# Patient Record
Sex: Female | Born: 1935 | Race: White | Hispanic: No | Smoking: Former smoker
Health system: Southern US, Community
[De-identification: ages and names within clinical notes are randomized; demographics above are authoritative.]

## PROBLEM LIST (undated history)

## (undated) DIAGNOSIS — M2550 Pain in unspecified joint: Secondary | ICD-10-CM

## (undated) DIAGNOSIS — E039 Hypothyroidism, unspecified: Secondary | ICD-10-CM

## (undated) DIAGNOSIS — M545 Low back pain, unspecified: Secondary | ICD-10-CM

## (undated) DIAGNOSIS — M1990 Unspecified osteoarthritis, unspecified site: Secondary | ICD-10-CM

## (undated) DIAGNOSIS — R350 Frequency of micturition: Secondary | ICD-10-CM

## (undated) DIAGNOSIS — Z8619 Personal history of other infectious and parasitic diseases: Secondary | ICD-10-CM

## (undated) DIAGNOSIS — M810 Age-related osteoporosis without current pathological fracture: Secondary | ICD-10-CM

## (undated) DIAGNOSIS — K219 Gastro-esophageal reflux disease without esophagitis: Secondary | ICD-10-CM

## (undated) DIAGNOSIS — H3530 Unspecified macular degeneration: Secondary | ICD-10-CM

## (undated) DIAGNOSIS — C801 Malignant (primary) neoplasm, unspecified: Secondary | ICD-10-CM

## (undated) DIAGNOSIS — I10 Essential (primary) hypertension: Secondary | ICD-10-CM

## (undated) DIAGNOSIS — K5900 Constipation, unspecified: Secondary | ICD-10-CM

## (undated) DIAGNOSIS — T4145XA Adverse effect of unspecified anesthetic, initial encounter: Secondary | ICD-10-CM

## (undated) DIAGNOSIS — G4700 Insomnia, unspecified: Secondary | ICD-10-CM

## (undated) DIAGNOSIS — K5790 Diverticulosis of intestine, part unspecified, without perforation or abscess without bleeding: Secondary | ICD-10-CM

## (undated) DIAGNOSIS — T8859XA Other complications of anesthesia, initial encounter: Secondary | ICD-10-CM

## (undated) DIAGNOSIS — R1310 Dysphagia, unspecified: Secondary | ICD-10-CM

## (undated) DIAGNOSIS — M2540 Effusion, unspecified joint: Secondary | ICD-10-CM

## (undated) DIAGNOSIS — R351 Nocturia: Secondary | ICD-10-CM

## (undated) DIAGNOSIS — H409 Unspecified glaucoma: Secondary | ICD-10-CM

## (undated) DIAGNOSIS — K5792 Diverticulitis of intestine, part unspecified, without perforation or abscess without bleeding: Secondary | ICD-10-CM

## (undated) DIAGNOSIS — K529 Noninfective gastroenteritis and colitis, unspecified: Secondary | ICD-10-CM

## (undated) HISTORY — PX: NECK SURGERY: SHX720

## (undated) HISTORY — DX: Malignant (primary) neoplasm, unspecified: C80.1

## (undated) HISTORY — PX: SHOULDER ARTHROSCOPY WITH LABRAL REPAIR: SHX5691

## (undated) HISTORY — PX: BACK SURGERY: SHX140

## (undated) HISTORY — PX: OTHER SURGICAL HISTORY: SHX169

## (undated) HISTORY — PX: HAND SURGERY: SHX662

## (undated) HISTORY — PX: COLONOSCOPY: SHX174

## (undated) HISTORY — PX: HIP PINNING: SHX1757

## (undated) HISTORY — DX: Essential (primary) hypertension: I10

## (undated) HISTORY — PX: KNEE FUSION: SUR623

---

## 1993-07-17 HISTORY — PX: BREAST SURGERY: SHX581

## 1998-05-24 ENCOUNTER — Encounter: Payer: Self-pay | Admitting: Endocrinology

## 1998-05-24 ENCOUNTER — Ambulatory Visit (HOSPITAL_COMMUNITY): Admission: RE | Admit: 1998-05-24 | Discharge: 1998-05-24 | Payer: Self-pay | Admitting: Endocrinology

## 1998-06-02 ENCOUNTER — Ambulatory Visit (HOSPITAL_COMMUNITY): Admission: RE | Admit: 1998-06-02 | Discharge: 1998-06-02 | Payer: Self-pay | Admitting: Endocrinology

## 1998-06-02 ENCOUNTER — Encounter: Payer: Self-pay | Admitting: Endocrinology

## 1999-05-27 ENCOUNTER — Encounter: Payer: Self-pay | Admitting: Endocrinology

## 1999-05-27 ENCOUNTER — Encounter: Admission: RE | Admit: 1999-05-27 | Discharge: 1999-05-27 | Payer: Self-pay | Admitting: Endocrinology

## 2000-04-14 ENCOUNTER — Emergency Department (HOSPITAL_COMMUNITY): Admission: EM | Admit: 2000-04-14 | Discharge: 2000-04-14 | Payer: Self-pay | Admitting: Emergency Medicine

## 2000-06-19 ENCOUNTER — Encounter: Admission: RE | Admit: 2000-06-19 | Discharge: 2000-06-19 | Payer: Self-pay | Admitting: Endocrinology

## 2000-06-19 ENCOUNTER — Encounter: Payer: Self-pay | Admitting: Endocrinology

## 2001-07-02 ENCOUNTER — Encounter: Admission: RE | Admit: 2001-07-02 | Discharge: 2001-07-02 | Payer: Self-pay | Admitting: Endocrinology

## 2001-07-02 ENCOUNTER — Encounter: Payer: Self-pay | Admitting: Endocrinology

## 2001-10-30 ENCOUNTER — Encounter: Payer: Self-pay | Admitting: Endocrinology

## 2001-10-30 ENCOUNTER — Encounter: Admission: RE | Admit: 2001-10-30 | Discharge: 2001-10-30 | Payer: Self-pay | Admitting: Endocrinology

## 2002-06-18 ENCOUNTER — Encounter: Payer: Self-pay | Admitting: Endocrinology

## 2002-06-18 ENCOUNTER — Encounter: Admission: RE | Admit: 2002-06-18 | Discharge: 2002-06-18 | Payer: Self-pay | Admitting: Endocrinology

## 2002-06-27 ENCOUNTER — Encounter: Admission: RE | Admit: 2002-06-27 | Discharge: 2002-06-27 | Payer: Self-pay | Admitting: Endocrinology

## 2002-06-27 ENCOUNTER — Encounter (INDEPENDENT_AMBULATORY_CARE_PROVIDER_SITE_OTHER): Payer: Self-pay | Admitting: Specialist

## 2002-06-27 ENCOUNTER — Other Ambulatory Visit: Admission: RE | Admit: 2002-06-27 | Discharge: 2002-06-27 | Payer: Self-pay | Admitting: Radiology

## 2002-06-27 ENCOUNTER — Encounter: Payer: Self-pay | Admitting: Endocrinology

## 2002-07-29 ENCOUNTER — Encounter: Payer: Self-pay | Admitting: Surgery

## 2002-07-29 ENCOUNTER — Encounter: Admission: RE | Admit: 2002-07-29 | Discharge: 2002-07-29 | Payer: Self-pay | Admitting: Surgery

## 2002-07-30 ENCOUNTER — Encounter: Payer: Self-pay | Admitting: Surgery

## 2002-07-30 ENCOUNTER — Ambulatory Visit (HOSPITAL_BASED_OUTPATIENT_CLINIC_OR_DEPARTMENT_OTHER): Admission: RE | Admit: 2002-07-30 | Discharge: 2002-07-30 | Payer: Self-pay | Admitting: Surgery

## 2002-07-30 ENCOUNTER — Encounter (INDEPENDENT_AMBULATORY_CARE_PROVIDER_SITE_OTHER): Payer: Self-pay | Admitting: *Deleted

## 2002-07-30 ENCOUNTER — Encounter: Admission: RE | Admit: 2002-07-30 | Discharge: 2002-07-30 | Payer: Self-pay | Admitting: Surgery

## 2002-08-05 ENCOUNTER — Emergency Department (HOSPITAL_COMMUNITY): Admission: EM | Admit: 2002-08-05 | Discharge: 2002-08-05 | Payer: Self-pay | Admitting: Emergency Medicine

## 2002-08-19 ENCOUNTER — Ambulatory Visit: Admission: RE | Admit: 2002-08-19 | Discharge: 2002-11-06 | Payer: Self-pay | Admitting: *Deleted

## 2002-11-13 ENCOUNTER — Encounter: Admission: RE | Admit: 2002-11-13 | Discharge: 2002-11-13 | Payer: Self-pay | Admitting: Geriatric Medicine

## 2002-11-13 ENCOUNTER — Encounter: Payer: Self-pay | Admitting: Geriatric Medicine

## 2003-02-06 ENCOUNTER — Encounter: Admission: RE | Admit: 2003-02-06 | Discharge: 2003-02-06 | Payer: Self-pay | Admitting: Oncology

## 2003-02-06 ENCOUNTER — Encounter: Payer: Self-pay | Admitting: Oncology

## 2003-06-09 ENCOUNTER — Encounter (INDEPENDENT_AMBULATORY_CARE_PROVIDER_SITE_OTHER): Payer: Self-pay | Admitting: *Deleted

## 2003-06-09 ENCOUNTER — Ambulatory Visit (HOSPITAL_COMMUNITY): Admission: RE | Admit: 2003-06-09 | Discharge: 2003-06-09 | Payer: Self-pay | Admitting: Gastroenterology

## 2003-06-30 ENCOUNTER — Encounter: Admission: RE | Admit: 2003-06-30 | Discharge: 2003-06-30 | Payer: Self-pay | Admitting: Geriatric Medicine

## 2004-03-04 ENCOUNTER — Ambulatory Visit (HOSPITAL_COMMUNITY): Admission: RE | Admit: 2004-03-04 | Discharge: 2004-03-04 | Payer: Self-pay | Admitting: Oncology

## 2004-03-16 ENCOUNTER — Ambulatory Visit (HOSPITAL_BASED_OUTPATIENT_CLINIC_OR_DEPARTMENT_OTHER): Admission: RE | Admit: 2004-03-16 | Discharge: 2004-03-16 | Payer: Self-pay | Admitting: Orthopedic Surgery

## 2004-07-06 ENCOUNTER — Encounter: Admission: RE | Admit: 2004-07-06 | Discharge: 2004-07-06 | Payer: Self-pay | Admitting: Oncology

## 2004-09-02 ENCOUNTER — Ambulatory Visit: Payer: Self-pay | Admitting: Oncology

## 2004-09-20 ENCOUNTER — Inpatient Hospital Stay (HOSPITAL_COMMUNITY): Admission: RE | Admit: 2004-09-20 | Discharge: 2004-09-26 | Payer: Self-pay | Admitting: Specialist

## 2004-12-27 ENCOUNTER — Encounter: Admission: RE | Admit: 2004-12-27 | Discharge: 2004-12-27 | Payer: Self-pay | Admitting: Geriatric Medicine

## 2005-01-26 ENCOUNTER — Other Ambulatory Visit: Admission: RE | Admit: 2005-01-26 | Discharge: 2005-01-26 | Payer: Self-pay | Admitting: Obstetrics and Gynecology

## 2005-03-02 ENCOUNTER — Ambulatory Visit: Payer: Self-pay | Admitting: Oncology

## 2005-05-11 ENCOUNTER — Ambulatory Visit: Payer: Self-pay | Admitting: Oncology

## 2005-05-29 ENCOUNTER — Encounter: Admission: RE | Admit: 2005-05-29 | Discharge: 2005-05-29 | Payer: Self-pay | Admitting: Geriatric Medicine

## 2005-07-05 ENCOUNTER — Encounter: Admission: RE | Admit: 2005-07-05 | Discharge: 2005-07-05 | Payer: Self-pay | Admitting: Geriatric Medicine

## 2005-07-07 ENCOUNTER — Encounter: Admission: RE | Admit: 2005-07-07 | Discharge: 2005-07-07 | Payer: Self-pay | Admitting: Geriatric Medicine

## 2005-07-12 ENCOUNTER — Encounter: Admission: RE | Admit: 2005-07-12 | Discharge: 2005-07-12 | Payer: Self-pay | Admitting: Specialist

## 2005-09-04 ENCOUNTER — Ambulatory Visit: Payer: Self-pay | Admitting: Oncology

## 2006-01-25 ENCOUNTER — Encounter: Admission: RE | Admit: 2006-01-25 | Discharge: 2006-01-25 | Payer: Self-pay | Admitting: Geriatric Medicine

## 2006-01-30 ENCOUNTER — Encounter: Admission: RE | Admit: 2006-01-30 | Discharge: 2006-01-30 | Payer: Self-pay | Admitting: Geriatric Medicine

## 2006-02-28 ENCOUNTER — Ambulatory Visit: Payer: Self-pay | Admitting: Oncology

## 2006-03-05 LAB — COMPREHENSIVE METABOLIC PANEL
AST: 13 U/L (ref 0–37)
BUN: 37 mg/dL — ABNORMAL HIGH (ref 6–23)
CO2: 27 mEq/L (ref 19–32)
Calcium: 9.6 mg/dL (ref 8.4–10.5)
Chloride: 103 mEq/L (ref 96–112)
Creatinine, Ser: 1.37 mg/dL — ABNORMAL HIGH (ref 0.40–1.20)

## 2006-04-30 ENCOUNTER — Ambulatory Visit (HOSPITAL_COMMUNITY): Admission: RE | Admit: 2006-04-30 | Discharge: 2006-05-01 | Payer: Self-pay | Admitting: Orthopedic Surgery

## 2006-05-22 ENCOUNTER — Encounter: Admission: RE | Admit: 2006-05-22 | Discharge: 2006-05-22 | Payer: Self-pay | Admitting: Geriatric Medicine

## 2006-07-09 ENCOUNTER — Encounter: Admission: RE | Admit: 2006-07-09 | Discharge: 2006-07-09 | Payer: Self-pay | Admitting: Oncology

## 2006-08-30 ENCOUNTER — Ambulatory Visit: Payer: Self-pay | Admitting: Oncology

## 2006-09-04 LAB — CBC WITH DIFFERENTIAL/PLATELET
Basophils Absolute: 0 10*3/uL (ref 0.0–0.1)
Eosinophils Absolute: 0.1 10*3/uL (ref 0.0–0.5)
HCT: 39 % (ref 34.8–46.6)
HGB: 13.4 g/dL (ref 11.6–15.9)
MONO#: 0.8 10*3/uL (ref 0.1–0.9)
NEUT#: 4 10*3/uL (ref 1.5–6.5)
NEUT%: 61.5 % (ref 39.6–76.8)
WBC: 6.4 10*3/uL (ref 3.9–10.0)
lymph#: 1.5 10*3/uL (ref 0.9–3.3)

## 2006-09-04 LAB — TSH: TSH: 2.865 u[IU]/mL (ref 0.350–5.500)

## 2006-09-04 LAB — COMPREHENSIVE METABOLIC PANEL
Albumin: 3.8 g/dL (ref 3.5–5.2)
BUN: 21 mg/dL (ref 6–23)
CO2: 31 mEq/L (ref 19–32)
Calcium: 9.3 mg/dL (ref 8.4–10.5)
Chloride: 107 mEq/L (ref 96–112)
Creatinine, Ser: 1.31 mg/dL — ABNORMAL HIGH (ref 0.40–1.20)

## 2007-01-08 ENCOUNTER — Encounter: Admission: RE | Admit: 2007-01-08 | Discharge: 2007-01-08 | Payer: Self-pay | Admitting: Oncology

## 2007-01-14 ENCOUNTER — Ambulatory Visit: Payer: Self-pay | Admitting: Oncology

## 2007-02-19 ENCOUNTER — Encounter: Admission: RE | Admit: 2007-02-19 | Discharge: 2007-02-19 | Payer: Self-pay | Admitting: Pulmonary Disease

## 2007-04-03 ENCOUNTER — Ambulatory Visit (HOSPITAL_COMMUNITY): Admission: RE | Admit: 2007-04-03 | Discharge: 2007-04-03 | Payer: Self-pay | Admitting: Gastroenterology

## 2007-05-28 ENCOUNTER — Inpatient Hospital Stay (HOSPITAL_COMMUNITY): Admission: RE | Admit: 2007-05-28 | Discharge: 2007-06-03 | Payer: Self-pay | Admitting: Orthopedic Surgery

## 2007-07-19 ENCOUNTER — Ambulatory Visit (HOSPITAL_COMMUNITY): Admission: RE | Admit: 2007-07-19 | Discharge: 2007-07-19 | Payer: Self-pay | Admitting: Orthopedic Surgery

## 2007-07-19 ENCOUNTER — Encounter (INDEPENDENT_AMBULATORY_CARE_PROVIDER_SITE_OTHER): Payer: Self-pay | Admitting: Orthopedic Surgery

## 2007-07-19 ENCOUNTER — Ambulatory Visit: Payer: Self-pay | Admitting: Surgery

## 2007-07-24 ENCOUNTER — Ambulatory Visit: Payer: Self-pay | Admitting: Oncology

## 2007-07-24 ENCOUNTER — Encounter: Admission: RE | Admit: 2007-07-24 | Discharge: 2007-07-24 | Payer: Self-pay | Admitting: Oncology

## 2007-07-26 LAB — COMPREHENSIVE METABOLIC PANEL
AST: 23 U/L (ref 0–37)
Albumin: 4 g/dL (ref 3.5–5.2)
Alkaline Phosphatase: 76 U/L (ref 39–117)
BUN: 21 mg/dL (ref 6–23)
Calcium: 8.9 mg/dL (ref 8.4–10.5)
Chloride: 105 mEq/L (ref 96–112)
Potassium: 4.7 mEq/L (ref 3.5–5.3)
Sodium: 140 mEq/L (ref 135–145)
Total Protein: 6 g/dL (ref 6.0–8.3)

## 2007-07-26 LAB — CBC WITH DIFFERENTIAL/PLATELET
Basophils Absolute: 0 10*3/uL (ref 0.0–0.1)
EOS%: 4 % (ref 0.0–7.0)
Eosinophils Absolute: 0.2 10*3/uL (ref 0.0–0.5)
HGB: 12.7 g/dL (ref 11.6–15.9)
MCH: 33.7 pg (ref 26.0–34.0)
NEUT#: 3.8 10*3/uL (ref 1.5–6.5)
RBC: 3.76 10*6/uL (ref 3.70–5.32)
RDW: 16.5 % — ABNORMAL HIGH (ref 11.3–14.5)
lymph#: 1.2 10*3/uL (ref 0.9–3.3)

## 2008-01-21 ENCOUNTER — Ambulatory Visit: Payer: Self-pay | Admitting: Oncology

## 2008-01-28 LAB — COMPREHENSIVE METABOLIC PANEL
AST: 17 U/L (ref 0–37)
Albumin: 4 g/dL (ref 3.5–5.2)
Alkaline Phosphatase: 47 U/L (ref 39–117)
BUN: 24 mg/dL — ABNORMAL HIGH (ref 6–23)
Potassium: 4.3 mEq/L (ref 3.5–5.3)
Sodium: 141 mEq/L (ref 135–145)

## 2008-01-28 LAB — CBC WITH DIFFERENTIAL/PLATELET
Basophils Absolute: 0 10*3/uL (ref 0.0–0.1)
EOS%: 2.4 % (ref 0.0–7.0)
MCH: 33.8 pg (ref 26.0–34.0)
MCV: 100.5 fL (ref 81.0–101.0)
MONO%: 13.6 % — ABNORMAL HIGH (ref 0.0–13.0)
RBC: 4.01 10*6/uL (ref 3.70–5.32)
RDW: 12.9 % (ref 11.3–14.5)

## 2008-02-21 ENCOUNTER — Encounter: Admission: RE | Admit: 2008-02-21 | Discharge: 2008-02-21 | Payer: Self-pay | Admitting: Surgery

## 2008-07-27 ENCOUNTER — Ambulatory Visit: Payer: Self-pay | Admitting: Oncology

## 2008-07-29 LAB — CBC WITH DIFFERENTIAL/PLATELET
EOS%: 0.8 % (ref 0.0–7.0)
MCH: 34.3 pg — ABNORMAL HIGH (ref 26.0–34.0)
MCV: 100.7 fL (ref 81.0–101.0)
MONO%: 9.1 % (ref 0.0–13.0)
NEUT#: 5.3 10*3/uL (ref 1.5–6.5)
RBC: 4.3 10*6/uL (ref 3.70–5.32)
RDW: 12.5 % (ref 11.3–14.5)

## 2008-07-29 LAB — COMPREHENSIVE METABOLIC PANEL
ALT: 20 U/L (ref 0–35)
AST: 16 U/L (ref 0–37)
Albumin: 3.7 g/dL (ref 3.5–5.2)
Alkaline Phosphatase: 69 U/L (ref 39–117)
Potassium: 4.4 mEq/L (ref 3.5–5.3)
Sodium: 143 mEq/L (ref 135–145)
Total Protein: 6 g/dL (ref 6.0–8.3)

## 2008-08-06 ENCOUNTER — Encounter: Admission: RE | Admit: 2008-08-06 | Discharge: 2008-08-06 | Payer: Self-pay | Admitting: Orthopedic Surgery

## 2008-09-03 ENCOUNTER — Ambulatory Visit (HOSPITAL_COMMUNITY): Admission: RE | Admit: 2008-09-03 | Discharge: 2008-09-04 | Payer: Self-pay | Admitting: Orthopedic Surgery

## 2008-09-14 IMAGING — CR DG KNEE 1-2V PORT*R*
2 series · 2 of 2 positions shown · non-contrast
Comparison: None available.

Right knee 2 views, 05/28/2007, 8373 hours.
INDICATION: Right total knee replacement.

[ap/obl knee]
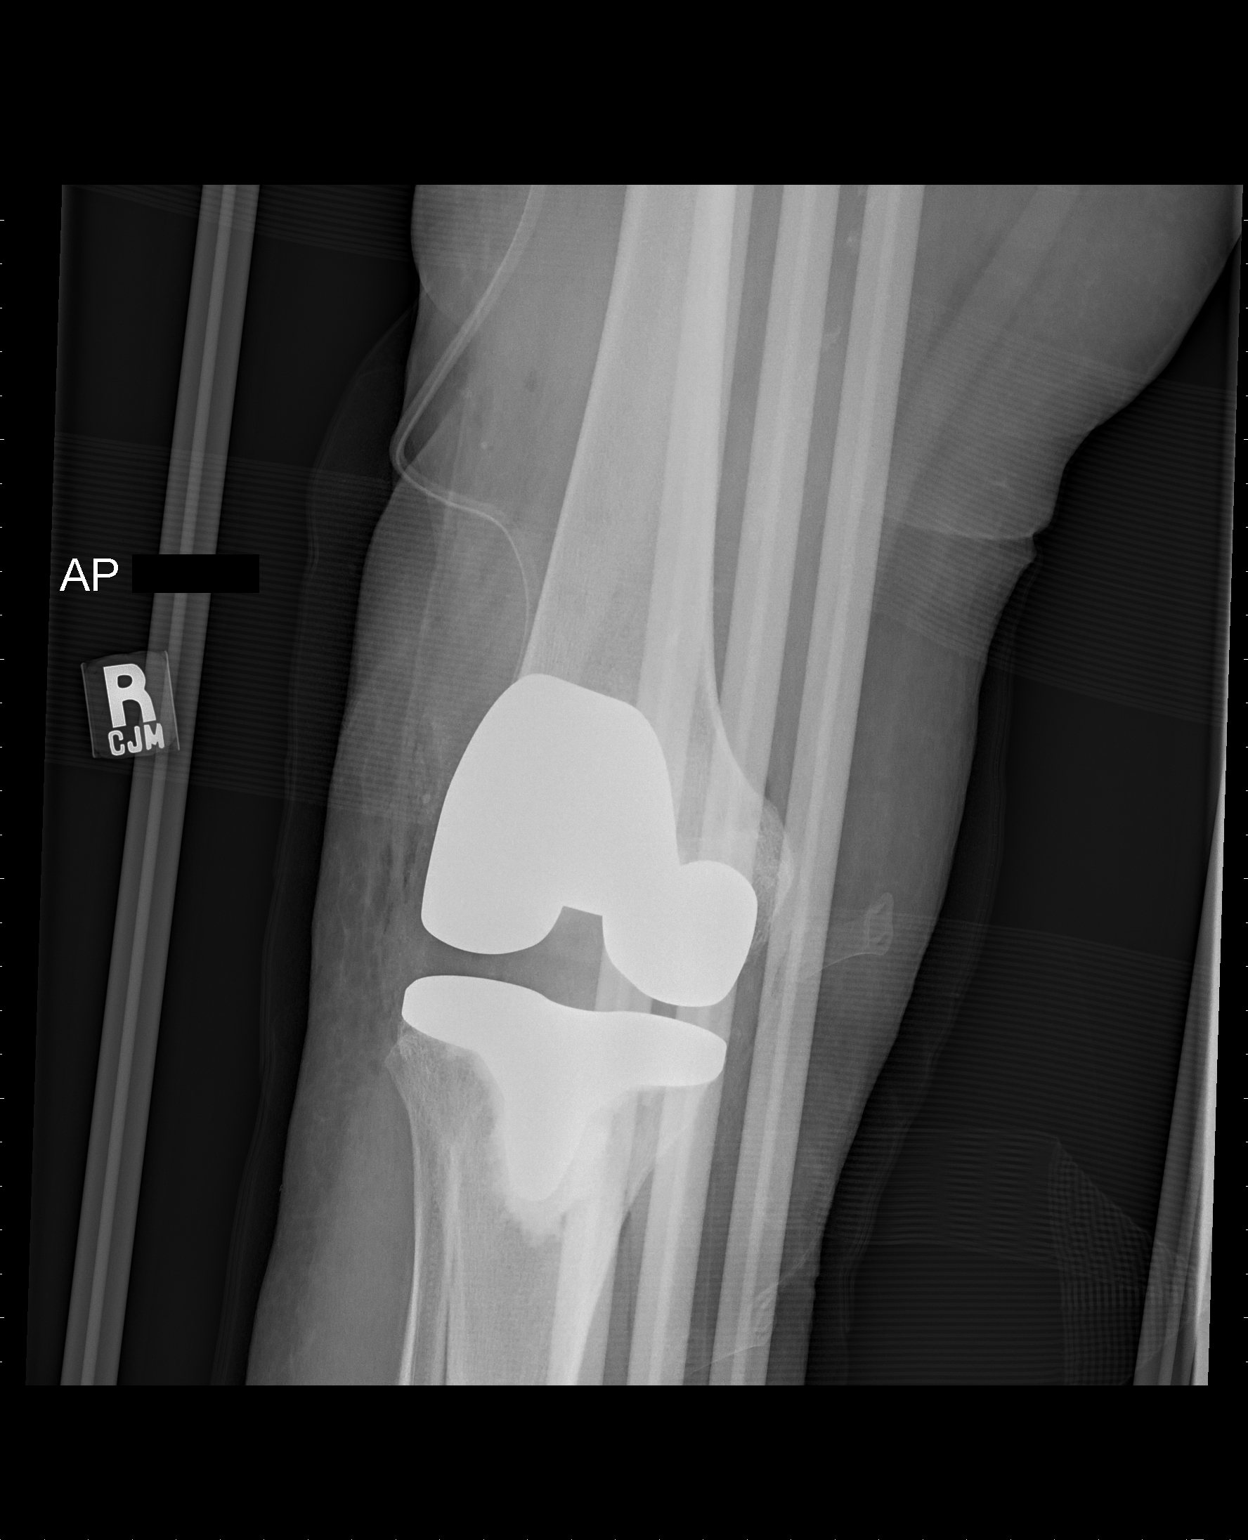

[knee lat]
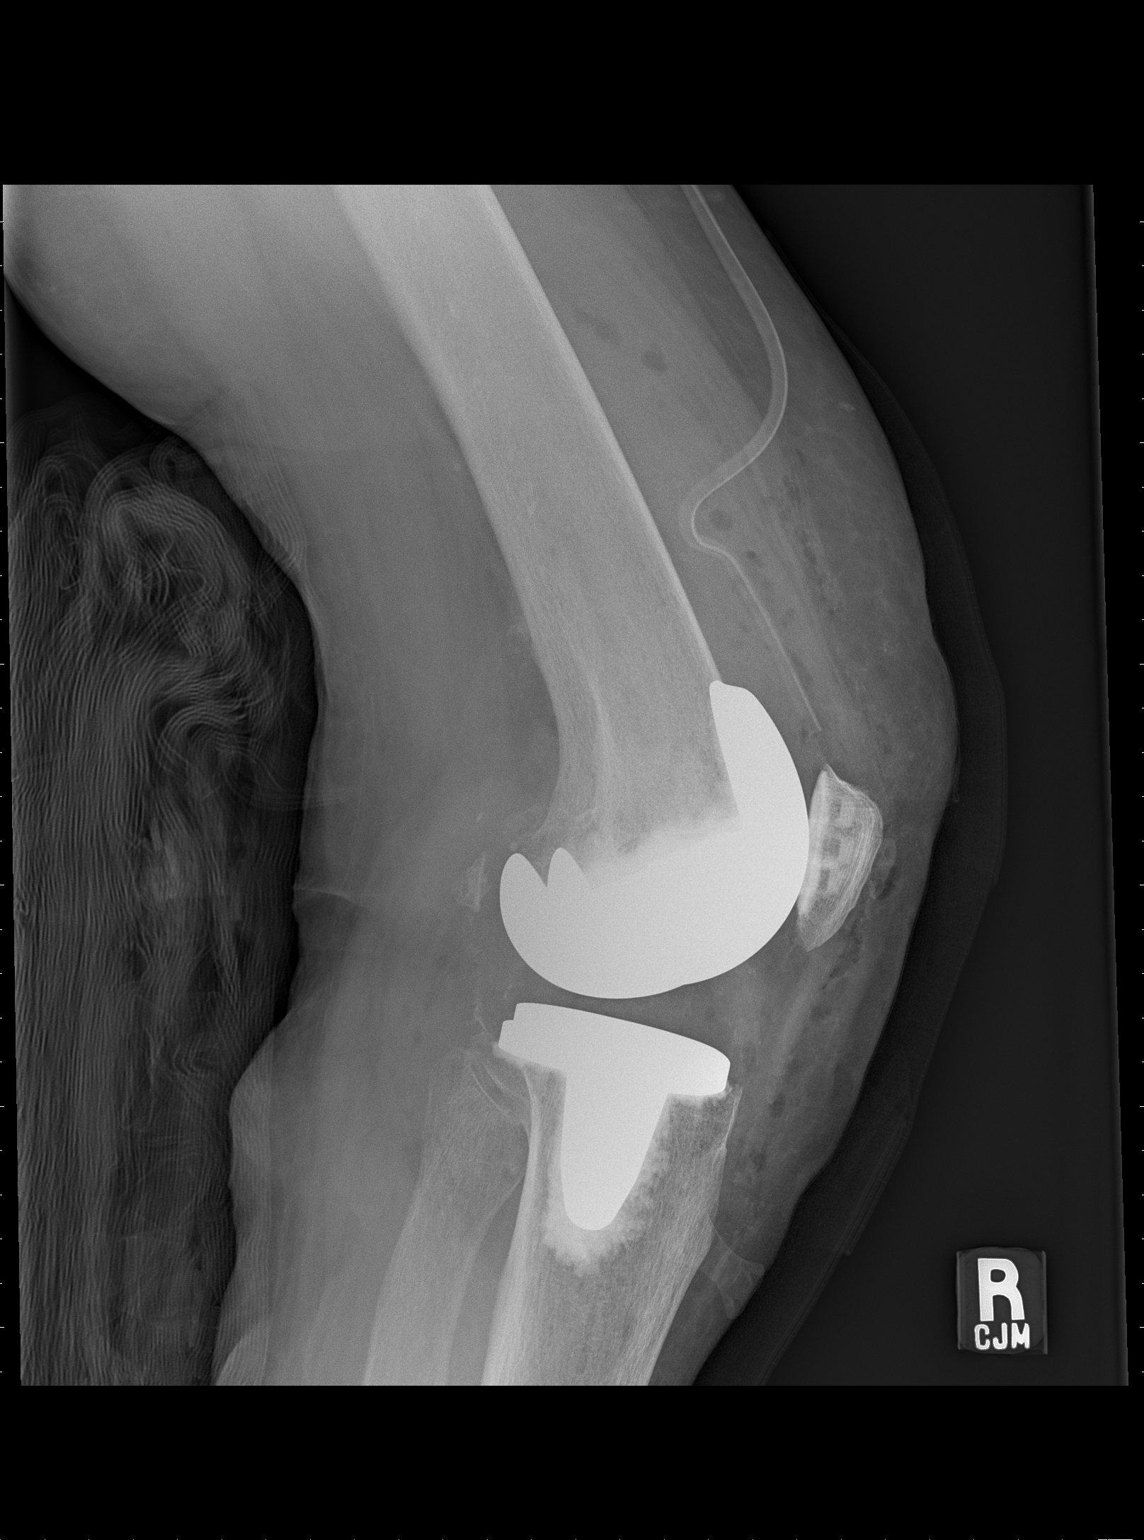

[2 of 2 positions shown; findings below may reference images not displayed]

FINDINGS: Total knee arthroplasty is present, with postsurgical changes in the
anterior soft tissues of the knee. No hardware complication identified.
Alignment anatomic. Splint in place.
IMPRESSION: Uncomplicated right three-part total knee arthroplasty.

## 2009-02-04 ENCOUNTER — Ambulatory Visit: Payer: Self-pay | Admitting: Oncology

## 2009-05-14 ENCOUNTER — Encounter: Admission: RE | Admit: 2009-05-14 | Discharge: 2009-05-14 | Payer: Self-pay | Admitting: Orthopedic Surgery

## 2009-05-14 ENCOUNTER — Encounter: Payer: Self-pay | Admitting: Orthopedic Surgery

## 2009-06-17 ENCOUNTER — Encounter: Admission: RE | Admit: 2009-06-17 | Discharge: 2009-06-17 | Payer: Self-pay | Admitting: Geriatric Medicine

## 2009-06-29 ENCOUNTER — Emergency Department (HOSPITAL_COMMUNITY): Admission: EM | Admit: 2009-06-29 | Discharge: 2009-06-29 | Payer: Self-pay | Admitting: Emergency Medicine

## 2009-07-14 ENCOUNTER — Encounter: Admission: RE | Admit: 2009-07-14 | Discharge: 2009-07-14 | Payer: Self-pay | Admitting: Geriatric Medicine

## 2009-07-20 ENCOUNTER — Inpatient Hospital Stay (HOSPITAL_COMMUNITY): Admission: RE | Admit: 2009-07-20 | Discharge: 2009-07-23 | Payer: Self-pay | Admitting: Orthopedic Surgery

## 2010-01-19 ENCOUNTER — Emergency Department (HOSPITAL_COMMUNITY): Admission: EM | Admit: 2010-01-19 | Discharge: 2010-01-19 | Payer: Self-pay | Admitting: Emergency Medicine

## 2010-04-24 ENCOUNTER — Emergency Department (HOSPITAL_COMMUNITY): Admission: EM | Admit: 2010-04-24 | Discharge: 2010-04-25 | Payer: Self-pay | Admitting: Emergency Medicine

## 2010-06-13 ENCOUNTER — Observation Stay (HOSPITAL_COMMUNITY)
Admission: EM | Admit: 2010-06-13 | Discharge: 2010-06-14 | Payer: Self-pay | Source: Home / Self Care | Admitting: Emergency Medicine

## 2010-08-07 ENCOUNTER — Encounter: Payer: Self-pay | Admitting: Oncology

## 2010-08-08 ENCOUNTER — Encounter: Payer: Self-pay | Admitting: Oncology

## 2010-08-16 ENCOUNTER — Encounter
Admission: RE | Admit: 2010-08-16 | Discharge: 2010-08-16 | Payer: Self-pay | Source: Home / Self Care | Attending: Geriatric Medicine | Admitting: Geriatric Medicine

## 2010-09-27 LAB — CBC
HCT: 36.3 % (ref 36.0–46.0)
HCT: 39.4 % (ref 36.0–46.0)
MCH: 33.1 pg (ref 26.0–34.0)
MCHC: 32.5 g/dL (ref 30.0–36.0)
MCV: 100.8 fL — ABNORMAL HIGH (ref 78.0–100.0)
MCV: 101.8 fL — ABNORMAL HIGH (ref 78.0–100.0)
Platelets: 173 10*3/uL (ref 150–400)
RBC: 3.6 MIL/uL — ABNORMAL LOW (ref 3.87–5.11)
RDW: 12.9 % (ref 11.5–15.5)
RDW: 12.9 % (ref 11.5–15.5)
WBC: 6.1 10*3/uL (ref 4.0–10.5)

## 2010-09-27 LAB — CARDIAC PANEL(CRET KIN+CKTOT+MB+TROPI)
CK, MB: 1.8 ng/mL (ref 0.3–4.0)
CK, MB: 2.3 ng/mL (ref 0.3–4.0)
Relative Index: INVALID (ref 0.0–2.5)
Total CK: 42 U/L (ref 7–177)
Troponin I: 0.01 ng/mL (ref 0.00–0.06)
Troponin I: 0.03 ng/mL (ref 0.00–0.06)

## 2010-09-27 LAB — BASIC METABOLIC PANEL
BUN: 20 mg/dL (ref 6–23)
BUN: 26 mg/dL — ABNORMAL HIGH (ref 6–23)
CO2: 25 mEq/L (ref 19–32)
CO2: 27 mEq/L (ref 19–32)
Chloride: 109 mEq/L (ref 96–112)
Creatinine, Ser: 1.1 mg/dL (ref 0.4–1.2)
GFR calc non Af Amer: 45 mL/min — ABNORMAL LOW (ref >60)
Glucose, Bld: 117 mg/dL — ABNORMAL HIGH (ref 70–99)
Potassium: 3.9 mEq/L (ref 3.5–5.1)
Potassium: 4.1 mEq/L (ref 3.5–5.1)

## 2010-09-27 LAB — LIPID PANEL
HDL: 34 mg/dL — ABNORMAL LOW (ref >39)
Total CHOL/HDL Ratio: 5.1 RATIO
Triglycerides: 103 mg/dL (ref <150)
VLDL: 21 mg/dL (ref 0–40)

## 2010-09-27 LAB — CK TOTAL AND CKMB (NOT AT ARMC)
Relative Index: INVALID (ref 0.0–2.5)
Total CK: 62 U/L (ref 7–177)

## 2010-09-27 LAB — POCT CARDIAC MARKERS
CKMB, poc: 1.8 ng/mL (ref 1.0–8.0)
Myoglobin, poc: 78.5 ng/mL (ref 12–200)
Myoglobin, poc: 85.7 ng/mL (ref 12–200)
Troponin i, poc: <0.05 ng/mL (ref 0.00–0.09)

## 2010-09-27 LAB — D-DIMER, QUANTITATIVE: D-Dimer, Quant: 0.77 ug/mL-FEU — ABNORMAL HIGH (ref 0.00–0.48)

## 2010-09-29 LAB — COMPREHENSIVE METABOLIC PANEL
ALT: 11 U/L (ref 0–35)
AST: 17 U/L (ref 0–37)
Albumin: 3.2 g/dL — ABNORMAL LOW (ref 3.5–5.2)
Alkaline Phosphatase: 51 U/L (ref 39–117)
Calcium: 9 mg/dL (ref 8.4–10.5)
GFR calc Af Amer: 46 mL/min — ABNORMAL LOW (ref >60)
Glucose, Bld: 129 mg/dL — ABNORMAL HIGH (ref 70–99)
Potassium: 4.6 mEq/L (ref 3.5–5.1)
Sodium: 140 mEq/L (ref 135–145)
Total Protein: 5.7 g/dL — ABNORMAL LOW (ref 6.0–8.3)

## 2010-09-29 LAB — URINE CULTURE
Colony Count: 9000
Culture  Setup Time: 201110100904

## 2010-09-29 LAB — URINALYSIS, ROUTINE W REFLEX MICROSCOPIC
Bilirubin Urine: NEGATIVE
Hgb urine dipstick: NEGATIVE
Nitrite: NEGATIVE
Protein, ur: NEGATIVE mg/dL
Specific Gravity, Urine: 1.014 (ref 1.005–1.030)
Urobilinogen, UA: 0.2 mg/dL (ref 0.0–1.0)

## 2010-09-29 LAB — CBC
MCHC: 34.2 g/dL (ref 30.0–36.0)
Platelets: 237 10*3/uL (ref 150–400)
RDW: 13.2 % (ref 11.5–15.5)
WBC: 11.7 10*3/uL — ABNORMAL HIGH (ref 4.0–10.5)

## 2010-09-29 LAB — DIFFERENTIAL
Eosinophils Absolute: 0.2 10*3/uL (ref 0.0–0.7)
Lymphs Abs: 1.3 10*3/uL (ref 0.7–4.0)
Monocytes Absolute: 1 10*3/uL (ref 0.1–1.0)
Monocytes Relative: 8 % (ref 3–12)
Neutrophils Relative %: 78 % — ABNORMAL HIGH (ref 43–77)

## 2010-10-01 LAB — BASIC METABOLIC PANEL
BUN: 15 mg/dL (ref 6–23)
Calcium: 8.3 mg/dL — ABNORMAL LOW (ref 8.4–10.5)
Chloride: 102 mEq/L (ref 96–112)
Creatinine, Ser: 1.21 mg/dL — ABNORMAL HIGH (ref 0.4–1.2)
GFR calc Af Amer: 51 mL/min — ABNORMAL LOW (ref >60)
GFR calc non Af Amer: 52 mL/min — ABNORMAL LOW (ref >60)
Glucose, Bld: 130 mg/dL — ABNORMAL HIGH (ref 70–99)
Sodium: 134 mEq/L — ABNORMAL LOW (ref 135–145)
Sodium: 137 mEq/L (ref 135–145)

## 2010-10-01 LAB — CBC
Hemoglobin: 10.8 g/dL — ABNORMAL LOW (ref 12.0–15.0)
MCV: 101.2 fL — ABNORMAL HIGH (ref 78.0–100.0)
MCV: 101.2 fL — ABNORMAL HIGH (ref 78.0–100.0)
Platelets: 204 10*3/uL (ref 150–400)
RBC: 3.04 MIL/uL — ABNORMAL LOW (ref 3.87–5.11)
WBC: 10.6 10*3/uL — ABNORMAL HIGH (ref 4.0–10.5)
WBC: 11.1 10*3/uL — ABNORMAL HIGH (ref 4.0–10.5)

## 2010-10-01 LAB — URINALYSIS, ROUTINE W REFLEX MICROSCOPIC
Glucose, UA: NEGATIVE mg/dL
Hgb urine dipstick: NEGATIVE
Ketones, ur: NEGATIVE mg/dL
pH: 7.5 (ref 5.0–8.0)

## 2010-10-01 LAB — URINE MICROSCOPIC-ADD ON

## 2010-10-17 LAB — BASIC METABOLIC PANEL
CO2: 28 mEq/L (ref 19–32)
Calcium: 9.4 mg/dL (ref 8.4–10.5)
GFR calc Af Amer: 57 mL/min — ABNORMAL LOW (ref >60)
GFR calc non Af Amer: 47 mL/min — ABNORMAL LOW (ref >60)
Sodium: 139 mEq/L (ref 135–145)

## 2010-10-17 LAB — DIFFERENTIAL
Lymphocytes Relative: 6 % — ABNORMAL LOW (ref 12–46)
Monocytes Absolute: 0.6 10*3/uL (ref 0.1–1.0)
Monocytes Relative: 6 % (ref 3–12)
Neutro Abs: 9.8 10*3/uL — ABNORMAL HIGH (ref 1.7–7.7)

## 2010-10-17 LAB — CBC
Hemoglobin: 13.2 g/dL (ref 12.0–15.0)
RBC: 3.79 MIL/uL — ABNORMAL LOW (ref 3.87–5.11)

## 2010-10-17 LAB — TYPE AND SCREEN
ABO/RH(D): A POS
Antibody Screen: NEGATIVE

## 2010-10-17 LAB — APTT: aPTT: 33 seconds (ref 24–37)

## 2010-10-18 LAB — PROTIME-INR
INR: 1.03 (ref 0.00–1.49)
Prothrombin Time: 13.4 seconds (ref 11.6–15.2)

## 2010-10-18 LAB — APTT: aPTT: 28 seconds (ref 24–37)

## 2010-11-01 LAB — PROTIME-INR: INR: 0.9 (ref 0.00–1.49)

## 2010-11-01 LAB — CBC
MCHC: 34.4 g/dL (ref 30.0–36.0)
Platelets: 225 10*3/uL (ref 150–400)
RBC: 4.24 MIL/uL (ref 3.87–5.11)
RDW: 13.2 % (ref 11.5–15.5)
WBC: 9.8 10*3/uL (ref 4.0–10.5)

## 2010-11-01 LAB — BASIC METABOLIC PANEL
BUN: 9 mg/dL (ref 6–23)
Calcium: 9.4 mg/dL (ref 8.4–10.5)
Calcium: 8.1 mg/dL — ABNORMAL LOW (ref 8.4–10.5)
Creatinine, Ser: 1.21 mg/dL — ABNORMAL HIGH (ref 0.4–1.2)
Creatinine, Ser: 1.05 mg/dL (ref 0.4–1.2)
GFR calc Af Amer: 52 mL/min — ABNORMAL LOW (ref >60)
GFR calc non Af Amer: 50 mL/min — ABNORMAL LOW (ref >60)
Glucose, Bld: 99 mg/dL (ref 70–99)
Sodium: 142 mEq/L (ref 135–145)

## 2010-11-01 LAB — URINALYSIS, ROUTINE W REFLEX MICROSCOPIC
Bilirubin Urine: NEGATIVE
Ketones, ur: NEGATIVE mg/dL
Nitrite: NEGATIVE
Protein, ur: NEGATIVE mg/dL
Specific Gravity, Urine: 1.011 (ref 1.005–1.030)
Urobilinogen, UA: 0.2 mg/dL (ref 0.0–1.0)

## 2010-11-01 LAB — DIFFERENTIAL
Basophils Relative: 1 % (ref 0–1)
Lymphs Abs: 2.4 10*3/uL (ref 0.7–4.0)
Monocytes Relative: 11 % (ref 3–12)
Neutro Abs: 5.9 10*3/uL (ref 1.7–7.7)
Neutrophils Relative %: 60 % (ref 43–77)

## 2010-11-01 LAB — APTT: aPTT: 28 seconds (ref 24–37)

## 2010-11-29 NOTE — Discharge Summary (Signed)
[name], [name] NO.:  [id number]   MEDICAL RECORD NO.:  [id number]          PATIENT TYPE:  INP   LOCATION:  5038                         FACILITY:  [facility]   PHYSICIAN:  [name], M.D. DATE OF BIRTH:  01/12/1924   DATE OF ADMISSION:  09/03/2008  DATE OF DISCHARGE:  09/04/2008                               DISCHARGE SUMMARY   ADMISSION DIAGNOSIS:  Left shoulder pain secondary to rotator cuff tear.   DISCHARGE DIAGNOSIS:  Left shoulder pain secondary to rotator cuff tear,  status post left shoulder arthroscopy.   PROCEDURE:  The patient had a left shoulder arthroscopy for rotator cuff  tear.  She had a rotator cuff debridement, biceps tenotomy, along with  subacromial decompression by Dr. Malon Kindle on September 03, 2008.  Assistant was [employer].  General anesthesia was used.  No  complications.   HOSPITAL COURSE:  The patient was admitted on September 03, 2008 for the  above-stated procedure which she tolerated well.  After adequate time in  the postanesthesia care unit she was transferred up to 5000.  Postop day  #1 the patient complained about moderate pain to the left shoulder but  was neurovascularly intact.  Her wound was healing well.  No signs of  cellulitis, erythema or infection.  She was taken through some gentle  range of motion without any difficulty and the sling was kept in place.  Capillary refill was less than 2 seconds distally and overall she did  okay.  Patient to be discharged back to the care center at the [facility].   DISCHARGE/PLAN:  Patient to be discharged to the care center at [facility].  Physical therapy to work with gentle range of motion, isometric  strengthening for that left shoulder.  Wound care is daily dry dressing  change, but may leave the Mepilex dressing on for 4 days.   FOLLOW-UP:  Patient will follow back up with Dr. Malon Kindle in 2  weeks.   CONDITION:  Stable.   ALLERGIES:  The  patient has NO KNOWN DRUG ALLERGIES.   DISCHARGE MEDICATIONS:  1. Alphagan drops each eye twice a day  2. Os-Cal 1 tablet t.i.d.  3. Colace 100 mg p.o. b.i.d.  4. Lasix 40 mg p.o. daily.  5. Synthroid 75 mcg p.o. daily.  6. Robaxin 500 mg p.o. q.6 h.  7. Protonix 40 mg b.i.d.  8. MiraLax 17 grams p.o. daily p.r.n.  9. Zocor 20 mg p.o. daily.  10.Percocet 5/325 one or two tabs q.4-6 hours p.r.n. pain.      Thomas B. Durwin Nora, P.A.      [name], M.D.  Electronically Signed    TBD/MEDQ  D:  09/04/2008  T:  09/04/2008  Job:  [id number]

## 2010-11-29 NOTE — Op Note (Signed)
NAMEAN, LANNAN NO.:  [id number]   MEDICAL RECORD NO.:  [id number]          PATIENT TYPE:  AMB   LOCATION:  ENDO                         FACILITY:  [facility]   PHYSICIAN:  Graylin Shiver, M.D.   DATE OF BIRTH:  December 23, 1923   DATE OF PROCEDURE:  04/03/2007  DATE OF DISCHARGE:                               OPERATIVE REPORT   PROCEDURE:  Esophagogastroduodenoscopy with Savory dilatation.   INDICATION:  Dysphagia.   HISTORY:  The patient had a barium esophagram showing prominent  cricopharyngeus muscle with results of delay in 13-mm barium tablet,  presbyesophagus, and narrowing at the gastroesophageal junction.   INFORMED CONSENT:  Obtained after explanation of the risks of bleeding,  infection, and perforation.   PREMEDICATION:  Fentanyl 50 mcg IV, Versed 3 mg IV.   PROCEDURE:  With the patient in the left lateral decubitus position, the  Pentax gastroscope was inserted into the oropharynx and passed into the  esophagus.  It was advanced down the esophagus, then into the stomach,  and into the duodenum.  The second portion of the duodenal bulb looked  normal.  The stomach had normal-appearing mucosa.  There was a hiatal  hernia present.  In the distal esophagus there was a Schatzki's ring  which was narrowing the lumen somewhat, and the rest of the esophagus  looked normal the scope was advanced down into the stomach.  A guidewire  was advanced down the scope, and the scope was removed keeping the guide  wire in place in the stomach.  This was observed under fluoroscopy.   A 12 mm and then 15 mm Savory dilator was passed over the guide wire and  followed under fluoroscopy down to the stomach.  The guide wire and  dilators were then removed.  She tolerated the procedure well without  complications.   IMPRESSION:  1. Schatzki's ring.  2. Hiatal hernia.  3. Prominent cricopharyngeus muscle.   PLAN:  We will observe the response to the dilatation.         ______________________________  Graylin Shiver, M.D.    SFG/MEDQ  D:  04/04/2007  T:  04/04/2007  Job:  [id number]   cc:   Hal T. Stoneking, M.D.  Fax: [phone]

## 2010-11-29 NOTE — H&P (Signed)
[name], [name] NO.:  [id number]   MEDICAL RECORD NO.:  [id number]          PATIENT TYPE:  INP   LOCATION:  NA                           FACILITY:  [facility]   PHYSICIAN:  Maisie Fus B. Dixon, P.A.  DATE OF BIRTH:  1924-02-06   DATE OF ADMISSION:  DATE OF DISCHARGE:                              HISTORY & PHYSICAL   CHIEF COMPLAINT:  Ms. Spofford is an 77 year old female and she complains of  right knee pain.   HISTORY OF PRESENT ILLNESS:  The patient has had worsening right knee  pain as from her fracture and has failed conservative treatment.  The  patient has elected to have a right total knee arthroplasty by Dr.  Malon Kindle.   PAST MEDICAL HISTORY:  Significant for:  1. Hypertension.  2. Hyperlipidemia.  3. Hypothyroidism.  4. Osteoporosis.  5. Gastroesophageal reflux disease.  6. History of breast cancer on the right.  7. History of pancreatic cyst.   FAMILY MEDICAL HISTORY:  Significant for COPD and coronary artery  disease.   SOCIAL HISTORY:  The patient does not smoke or use alcohol and primary  care physician is Dr. Pete Glatter.   DRUG ALLERGIES:  NONE.   CURRENT MEDICATIONS:  1. Amlodipine 2.5 mg 1 tablet p.o. daily.  2. Enalapril 10 mg 1 tablet p.o. daily.  3. Lasix 40 mg p.o. daily.  4. Levothyroxine 88 mcg p.o. daily.  5. Omeprazole 20 mg p.o. daily.  6. Hydroxyzine 25 mg q.h.s.  7. Os-Cal 500 plus D t.i.d.  8. Eye Vit 1 b.i.d.  9. GenTeal eye drops 1 to 2 drops p.r.n.  10.Fentanyl transdermal patch 12 mcg per hour 1 every 72 hours.  11.Norco 1 to 2 tabs q.4 to 6 hours p.r.n. pain.  12.Bioflex ointment p.r.n.  13.Senokot 2 tablets q.h.s.  14.Saline nasal spray p.r.n.  15.Fosamax 70 mg 1 tablet every week.  16.Zocor 20 mg 1 p.o. daily.  17.Potassium elixir 20 mEq p.o. daily.   REVIEW OF SYSTEMS:  Worsening right knee pain with an antalgic gait and  history of breast cancer with inability to check blood pressures on the  right arm.   PHYSICAL EXAMINATION:  VITAL SIGNS:  Pulse 78.  Respirations 18.  Blood  pressure 128/78.  GENERAL:  A healthy-appearing 77 year old female in no acute distress,  pleasant mood and affect, alert and oriented x3.  NECK:  Full range of motion without any tenderness.  HEENT:  Cranial nerves II-XII grossly intact.  CHEST:  Has active breath sounds bilaterally with no wheezes, rhonchi or  rales.  HEART:  Shows regular rate and rhythm.  No murmur.  ABDOMEN:  Nontender, nondistended with active bowel sounds.  EXTREMITIES:  Shows mild tenderness to the right knee, especially medial  joint line with range of motion with some obvious crepitus.  SKIN:  Shows no rashes or edema.  NEUROVASCULARLY:  She is intact bilateral upper and lower extremities.   X-ray shows right end-stage osteoarthritis.   IMPRESSION:  Right end-stage osteoarthritis.   PLAN OF ACTION:  Have a right total knee arthroplasty by Dr. Viviann Spare  Norris.      Thomas B. Ferne Coe.     TBD/MEDQ  D:  05/22/2007  T:  05/22/2007  Job:  [id number]

## 2010-11-29 NOTE — Op Note (Signed)
[name], [name] NO.:  [id number]   MEDICAL RECORD NO.:  [id number]          PATIENT TYPE:  INP   LOCATION:  5017                         FACILITY:  [facility]   PHYSICIAN:  Almedia Balls. Ranell Patrick, M.D. DATE OF BIRTH:  [birth year]   DATE OF PROCEDURE:  05/28/2007  DATE OF DISCHARGE:                               OPERATIVE REPORT   PREOPERATIVE DIAGNOSIS:  Right knee end-stage osteoarthritis.   POSTOPERATIVE DIAGNOSIS:  Right knee end-stage osteoarthritis.   PROCEDURE PERFORMED:  Right knee total knee replacement using De[employer]uy  Sigma rotating platform prosthesis.   ATTENDING SURGEON:  Almedia Balls. Ranell Patrick, M.D.   ASSISTANT:  Konrad Felix Dixon, [place]   ANESTHESIA:  General anesthesia plus femoral block anesthesia was used.   ESTIMATED BLOOD LOSS:  Minimal.   FLUID REPLACEMENT:  1500 mL crystalloid.   URINE OUTPUT:  250 mL.   INSTRUMENT COUNT:  Correct.   COMPLICATIONS:  None.   ANTIBIOTICS:  Perioperative antibiotics given.   INDICATIONS:  The patient is an 77 year old resident of [facility] who  presents with end-stage the right knee osteoarthritis.  We treated her  for number of years with conservative means including injections, anti-  inflammatories, active modifications and therapy.  The patient presents  now with disabling pain to her right knee desiring total knee  replacement.  The patient had preoperative medical clearance and desires  operative treatment to restore function and eliminate her pain.  Informed consent was obtained.   DESCRIPTION OF PROCEDURE:  After adequate level of anesthesia was  achieved, the patient was positioned supine on the operating table.  The  right leg was correctly identified, nonsterile tourniquet was placed  around proximal thigh.  The right leg was sterilely prepped and draped  usual manner.  After exsanguination of limb using Esmarch bandage, we  elevated the tourniquet to 350 mmHg.  A longitudinal midline skin  incision was created with the knee in flexion.  Dissection carried  sharply down through subcutaneous tissues, identified the parapatellar  tissues.  We performed a medial parapatellar arthrotomy, we everted  patella and flexed the knee, identifying the distal femur.  We resected  the PCL and ACL and the tissue around the flare of the distal femur into  the condyles.  We then went ahead and performed a distal cut 5 degrees  right with 10 mm measured distal resection.  We then sized the femur to  a size 2.5 and performed anterior-posterior and chamfer cuts.  This was  nice flush cut right down on the anterior femoral cortex with no  notching.  We then switched to the tibia and we went ahead and resected  the tibia perpendicular to the long axis of the tibia with minimal  posterior slope of taking 4 mm off the affected side.  This was a little  tight with the our flexion block and extension blocks, checking our gaps  and then went ahead and resected 2 more mm for a total of 6 off the  affected side.  With this, we were able to fit in the 10 mm extension  block and flexion block without difficulty.  We went back to femur,  resected the notch with the box cut guide for the posterior cruciate  substituting prosthesis and this was for 2.5 and then we sized the  tibial which was a 2.5 and did our keel punch in preparation for  insertion of the trial tibia which we ahead and then placed trial femur  on and placed 10 then a 12.5 insert.  We had nice full extension with  good flexion stability and full range of motion.  We then performed  resurfacing of the patella starting with 21 mm thickness going down to  14 mm thickness and replacing with the 32 patellar button with normal  patellar tracking.  We removed all components.  We went ahead and  resected posterior osteophytes using curved osteotome,  checked  remaining soft tissue attachments.  She felt just a little bit tight  laterally so we went  ahead and released the popliteus off the femur and  did not completely release it but opened that up and it actually helped  balancing a little bit as well.  At this point we thoroughly irrigated,  I plugged the posterior femur with bone over the distal femoral hole  from the the step-cut drill and distal femoral intramedullary guide and  then plugged that with bone and then we went ahead and mixed cement.  This is Smart set high viscosity cement from De[employer]uy, vacuum mixed and  then applied first the tibia and placed tibial component on, impacted  that in place and the femoral component and then the patellar component.  Placed 12.5 insert in and placed knee in full extension and then removed  excess cement.  Once cement was allowed to harden, we removed excess  cement with 1/4 inch curved osteotome followed by a trial again with  12.5 insert.  We were happy with that trial and went ahead and selected  12.5 x 2.5 tibial insert, placed that in, took the knee through full  range of motion, excellent soft tissue balancing was achieved.  We next  placed a drain in the knee and then closed the parapatellar arthrotomy  using a interrupted #1 PDS suture figure-of-eight followed by 2-0 Vicryl  subcutaneous closure with 4-0 Monocryl for skin.  Steri-Strips applied  followed by sterile dressing and a compressive dressing with knee  immobilizer.  The patient tolerated procedure well.      Almedia Balls. Ranell Patrick, M.D.  Electronically Signed     SRN/MEDQ  D:  05/28/2007  T:  05/29/2007  Job:  [id number]   cc:   Dr. Thersa Salt, [facility]

## 2010-11-29 NOTE — Op Note (Signed)
[name], [name] NO.:  [id number]   MEDICAL RECORD NO.:  [id number]          PATIENT TYPE:  INP   LOCATION:  5038                         FACILITY:  [facility]   PHYSICIAN:  Almedia Balls. Ranell Patrick, M.D. DATE OF BIRTH:  [birth year]   DATE OF PROCEDURE:  09/03/2008  DATE OF DISCHARGE:                               OPERATIVE REPORT   PREOPERATIVE DIAGNOSIS:  Left shoulder rotator cuff tear, superior  labrum anterior and posterior lesion, osteoarthritis.   POSTOPERATIVE DIAGNOSES:  Left shoulder rotator cuff tear, superior  labrum anterior and posterior lesion, osteoarthritis, frozen shoulder.   PROCEDURE PERFORMED:  1. Left shoulder arthroscopy with extensive intra-articular      debridement of torn superior labrum, anterior to posterior.  2. Arthroscopic biceps tenotomy.  3. Arthroscopic rotator cuff debridement.  4. Arthroscopic subacromial decompression and labral debridement.  5. Capsular release.   ATTENDING SURGEON:  Almedia Balls. Ranell Patrick, M.D.   ASSISTANT:  Donnie Coffin. Dixon, PA-C   ANESTHESIA:  General anesthesia was used.   ESTIMATED BLOOD LOSS:  Minimal.   FLUID REPLACEMENT:  1200 mL crystalloid.   INSTRUMENT COUNTS:  Correct.   COMPLICATIONS:  There were no complications.   Preoperative antibiotics were given.   INDICATIONS:  The patient is an 77 year old female with a history of  worsening left shoulder pain secondary to torn rotator cuff.  The  patient has significant retraction of her cuff on MRI scan.  The patient  has had refractory pain despite conservative management including acute  modifications and injections.  The patient presents now for operative  debridement of likely degenerative labral tearing as well as some  chondroplasty of her arthritis noted in her shoulder and also  debridement of rotator cuff.  Informed consent was obtained.   DESCRIPTION OF PROCEDURE:  After an adequate level of anesthesia was  achieved, the patient was  positioned in the modified beach chair  position.  After an adequate level of general anesthesia was performed,  left shoulder was examined under anesthesia.  The patient had an  obviously stiff shoulder, forward elevation limited to 90 degrees, I was  able to manipulate this up to 160.  Abduction limited to 30 degrees,  manipulated to 90.  External rotation initially about 30 degrees.  She  is post manipulation up about 60 with significant improvement of 45  degrees or so and internal rotation after manipulation.  We went ahead  and sterilely prepped and draped the shoulder in the usual manner.  We  entered the shoulder using standard arthroscopic portals including  anterior, posterior, and lateral portals.  We identified significant  hyperemia and capsulitis within the joint.  There was significant  thickening of the middle glenohumeral ligament and inferior glenohumeral  ligament.  We performed a capsular release and released the medial  glenohumeral ligament and the rotator interval.  Subscapularis was  visualized, noted to be normal.  Rotator cuff was torn and retracted at  almost to the level of glenoid, performed a debridement of some rotator  cuff tissues hanging down.  There is extensively torn superior labrum  extending  down to the anterior and inferior labrum, and also in to the  posterior inferior labrum, performed a thorough labral debridement,  biceps released.  The biceps tendon was torn.  We used basket forceps  and motorized shaver.  Once we had done a complete labral debridement,  chondroplasty on a couple of very deeply worn areas in the posterior  glenoid, we went ahead and placed a scope in the subacromial space,  performed a thorough bursectomy and limited acromioplasty, careful not  to release the CA ligament.  We had attention mostly towards the  anterolateral border of the acromion to make sure there was no impinging  area there.  It was noted at that time that the  rotator cuff was  retracted.  We did use a cuff grasper and tried to reduce this tear and  this was felt to be very stiff and nonmobile, given the patient's age at  41 years, the fact that she did have her frozen shoulder which was  treatable by surgery.  We elected to not perform a mini-open incision  feeling that her cuff was likely not repairable and probably would not  heal.  At this point, given a third debridement and cleaned out the  rotator cuff outlet area, we concluded surgery, suturing the wounds with  4-0 Monocryl followed by Steri-Strips and sterile dressing.  The patient  tolerated the surgery well.      Almedia Balls. Ranell Patrick, M.D.  Electronically Signed     SRN/MEDQ  D:  09/03/2008  T:  09/04/2008  Job:  [id number]

## 2010-11-29 NOTE — Discharge Summary (Signed)
NAMEELLIEANA, Castro NO.:  [id number]   MEDICAL RECORD NO.:  [id number]          PATIENT TYPE:  INP   LOCATION:  5017                         FACILITY:  [facility]   PHYSICIAN:  Almedia Balls. Ranell Patrick, M.D. DATE OF BIRTH:  February 04, 1924   DATE OF ADMISSION:  05/28/2007  DATE OF DISCHARGE:  06/03/2007                               DISCHARGE SUMMARY   ADMISSION DIAGNOSIS:  Right end-stage knee osteoarthritis.   DISCHARGE DIAGNOSIS:  Right end-stage knee osteoarthritis.   BRIEF HISTORY:  Patient is an 77 year old female who presents with  worsening right knee osteoarthritis.  The patient has elected to have a  right total knee arthroplasty by Dr. Malon Kindle.   PROCEDURE:  The patient had a right total knee arthroplasty by Dr.  Malon Kindle on May 28, 2007.   ASSISTANT:  Standley Dakins, PA-C.   ANESTHESIA:  General.   ESTIMATED BLOOD LOSS:  Minimal.   COMPLICATIONS:  No complications.   HOSPITAL COURSE:  Patient was admitted on May 28, 2007 for the  above-stated procedure which she tolerated well.  After an adequate time  on the postanesthesia care unit, she was transferred up to 5000.  On  postop day 1, patient complained about moderate pain to the right knee.  Her drain was removed.  Dressing was left in place.  Neurovascularly she  was intact.  She was started on CPM machine and physical therapy.  She  is to complete physical therapy over the next couple of days and was  somewhat slow in regards to movement.  Thus, she was kept an extra  couple of days over the weekend before we could get to skilled nursing.  On postop day 5, she was feeling much better.  She had her Foley out.  IVs were discontinued and she was walking around with a rolling walker  quite well.  She is to be discharged to her skilled nursing facility in  [facility] on June 03, 2007.   CONDITION:  Her condition is stable.   DIET:  Her diet is regular.   FOLLOW UP:  The patient is  to follow back up with Dr. Malon Kindle in 2  weeks.   ALLERGIES:  PATIENT HAS NO KNOWN DRUG ALLERGIES.   DISCHARGE MEDICATIONS:  1. Norvasc 2.5 mg p.o. daily.  2. Alphagan eye drops 1 drop to both eyes b.i.d.  3. Calcium carbonate 1 tab p.o. daily.  4. Colace 100 mg p.o. b.i.d.  5. Vasotec 10 mg p.o. daily.  6. Duragesic patch 12 mcg every 3 days to be changed.  7. Ferrous sulfate 325 mg p.o. t.i.d.  8. Lasix 40 mg p.o. daily.  9. Hydroxyzine 25 mg p.o. nightly.  10.Levothyroxine 88 mcg p.o. daily.  11.Protonix 40 mg p.o. daily.  12.Coumadin per pharmacy protocol.  13.Vicodin 5 mg 1-2 tabs q.4-6 h. p.r.n. pain.  14.Robaxin q.6 h. scheduled.      Thomas B. Durwin Nora, P.A.      Almedia Balls. Ranell Patrick, M.D.  Electronically Signed    TBD/MEDQ  D:  06/03/2007  T:  06/03/2007  Job:  [id number] 

## 2010-12-02 NOTE — Consult Note (Signed)
NAMESYLVER, VANTASSELL NO.:  [id number]   MEDICAL RECORD NO.:  [id number]          PATIENT TYPE:  INP   LOCATION:  5002                         FACILITY:  [facility]   PHYSICIAN:  Melissa L. Ladona Ridgel, MD  DATE OF BIRTH:  02-15-1924   DATE OF CONSULTATION:  09/23/2004  DATE OF DISCHARGE:                                   CONSULTATION   REASON FOR CONSULTATION:  Postoperative confusion and hypoxia.   HISTORY OF PRESENT ILLNESS:  The patient is a 77 year old white female  admitted for elective surgery for a left C4 to C5 and left C5 to C6  foraminal stenosis. The surgery was completed on September 20, 2004. We have been  asked to evaluate this patient for increasing confusion and noted O2  desaturation today to 83% on room air. The patient notes document  postoperatively on September 22, 2004 that she was somewhat lethargic. Her  symptoms progressed onto decreased saturations today, September 23, 2004. Review  of the chart was undertaken, as the patient is unable to give a coherent  history secondary to confusion.   PAST MEDICAL HISTORY:  Hypertension, hyperlipidemia, hypothyroidism,  osteoporosis, gastroesophageal reflux disease, lower back pain, degenerative  joint disease of the knees, macular degeneration, right breast ductal  carcinoma in situ, open angled glaucoma and depression.   PAST SURGICAL HISTORY:  She had a lumpectomy for her ductal carcinoma in  situ in January of 2004. She remains on chemotherapy treatment for this with  Letrozole. In 1974, she had a laminectomy and she has had 3 cesarean  sections in the past.   SOCIAL HISTORY:  She quit tobacco approximately 11 years ago. She has a 50  pack year history. She denies ethanol at this time.   FAMILY HISTORY:  Her father is deceased secondary to myocardial infarction  at 97. Mother deceased at 27 secondary to unknown causes.   REVIEW OF SYSTEMS:  The patient is awake and alert enough to tell me that  she does not have  any nausea, vomiting, or diarrhea. She does have some neck  pain. Indicates that she has no other symptoms of dysuria or frequency,  however, at this time she does need to use the restroom and so she does have  a sense or urgency.   PHYSICAL EXAMINATION:  VITAL SIGNS:  T max was 100.5. Temperature at this  time is 98.9. Blood pressure 145/68, pulse 72, respiratory rate 18. She is  96% on 2 liters and 91% to 89% on room air.  GENERAL:  She appears sleepy but is spontaneously awake. She states that I  need to tell the care center that I am here. She knows that she has had  surgery but feels that she is at the [facility]  HEENT:  She is normocephalic with an ice pack over the surgical site. Pupils  are equal, round, and reactive to light. She does have some bilateral  ptosis. Moist mucous are dry.  NECK:  Decreased range of motion secondary to pain. There is no jugular  venous distention. No lymphadenopathy.  CHEST:  Decreased breath sounds bilaterally at the bases. There is no  rhonchi or wheezes.  CARDIOVASCULAR:  Regular rate and rhythm. Positive S1 and S2. No S3, S4,  murmurs, rubs, or gallops.  ABDOMEN:  Soft. She has minimal tenderness over the pubic synthesis  secondary to a distended bladder. Otherwise, she is not guarding. She does  have positive bowel sounds.  EXTREMITIES:  No clubbing, cyanosis, or edema.  NEUROLOGIC:  She appears to be neurologically intact with regard to power  being 4 over 5 symmetrically. Deep tendon reflexes are 2 with slight  increase in reflex for the left biceps. She is very into person but not  place or time. Toes are downgoing.   LABORATORY DATA:  White count 9.3, hemoglobin 13.4, hematocrit 38.2,  platelets 152,000. Sodium 136, potassium 3.1, chloride 96, CO2 30, BUN 9,  creatinine 1.0, glucose 110, calcium 9.0. Arterial blood gas on room air  shows a slight hypercarbic respiratory failure. She has a pH of 7.418, pCO2  is 50.4, pO2  is 50.7. Bicarb is 31.9.   A 2-D echocardiogram in 2004 showed an ejection fraction of 70% with a  normal study.   Last chest x-ray in August of 2005 showed borderline cardiomegaly with a  known density projecting over the spine, which has previously worked up and  represents spondylosis and spurring. There is no pulmonary edema on that  film. Repeat chest x-ray today shows low volume and bibasilar atelectasis.  There is no evidence for pulmonary edema or pneumonia at this time.   ASSESSMENT/PLAN:  This is a 77 year old white female with postoperative  sedation and hypoxia likely secondary to narcotic pain medications. However,  we need to rule out  other sources, i.e. pneumonia, pulmonary embolism or  cardiac. I agree with changing pain medications and will further request the  following:  1.  CARDIOVASCULAR:  She has normal ejection fraction as of 2004. Will send      cardiac enzymes for to be thorough and check an EKG. We may need      telemetry or higher level of care, depending on her status. Blood      pressure is stable. Will continue her current medications. I will check      a D-dimer and evaluate whether a chest CT is necessary. Currently with      the x-ray being negative, I am not favoring any chest CT.  2.  PULMONARY:  Hypercarbic respiratory failure likely secondary to      decreased respiratory drive secondary to narcotics. Agree with the      nebulizer's. I will add a flutter valve with those treatments and as      stated, will check a D-dimer.  3.  GENITOURINARY:  Will recheck a UA, C&S to rule out possible urinary      tract infection.  4.  GASTROINTESTINAL:  No complaints at this time. I agree with continuing      Protonix.  5.  ENDOCRINE:  She has hypothyroidism. Continue her Synthroid.  6.  EXTREMITIES:  Deep vein thrombosis prophylaxis, Ted hose versus Lovenox      as per orthopedics. 7.  NEUROLOGIC:  Sedation is likely secondary to narcotics. However, we need       to rule out possible other cause.      I will recommend starting Tylenol as a standing order with only p.r.n.      narcotic use. Oxy-IR 5 mg q. 12 p.r.n. for severe pain. I would like  to      hold her Desyrel for today and check a head CT to rule out possible      neurological event. At this time, I favor narcosis secondary to narcotic      pain medications.      MLT/MEDQ  D:  09/23/2004  T:  09/24/2004  Job:  [id number]   cc:   Hal T. Stoneking, M.D.  [address]  [place], [place] [address]  Fax: [phone]   Kerrin Champagne, M.D.  [address]  [place]  [place] [address]  Fax: [phone]

## 2010-12-06 NOTE — Op Note (Signed)
Katelyn, Katelyn NO.:  Castro   MEDICAL RECORD NO.:  [id number]          PATIENT TYPE:  OIB   LOCATION:  5041                         FACILITY:  [facility]   PHYSICIAN:  Almedia Balls. Ranell Patrick, M.D. DATE OF BIRTH:  [birth year]   DATE OF PROCEDURE:  04/30/2006  DATE OF DISCHARGE:  05/01/2006                                 OPERATIVE REPORT   PREOPERATIVE DIAGNOSES:  Left knee medial meniscus tear and right shoulder  rotator cuff tear and chronic pain.   POSTOPERATIVE DIAGNOSES:  Knee:  1. Left knee medial meniscus tear.  2. Left knee medial femoral condyle chondromalacia, grade 3.  3. Lateral meniscus tear.  Shoulder:  1. Right shoulder chronic rotator cuff tear, irreparable.  2. Right shoulder chronic subscapularis tear, irreparable.  3. Superior labral tear, anterior to posterior, with chronic biceps      rupture.  4. Minimal glenohumeral osteoarthritis.   PROCEDURES PERFORMED:  Left knee:  1. Left knee arthroscopy with medial and lateral meniscal debridement and      abrasion chondroplasty of medial femoral condyle.  Right shoulder:  1. Shoulder arthroscopy with extensive intra-articular debridement of torn      labrum, torn rotator cuff, and torn subscapularis with rotator interval      release followed by open exploration of rotator cuff tendons and      subscapularis tendons.   ATTENDING SURGEON:  Almedia Balls. Ranell Patrick, M.D.   ASSISTANT:  Modesto Charon, P.A.-C.   ANESTHESIA:  General anesthesia was used.   ESTIMATED BLOOD LOSS:  Minimal.   FLUID REPLACEMENTS:  2500 mL of crystalloid.   INSTRUMENT COUNTS:  Instrument counts were correct.   COMPLICATIONS:  There were no complications.   COMMENTS:  Perioperative antibiotics were given.   INDICATIONS FOR PROCEDURE:  The patient is an 77 year old female with  history of worsening right shoulder and left knee pain.  The patient has  known medial meniscus tear based on MRI scan.  The patient also has  noted  rotator cuff tear, which is questionably repairable on her MRI scan.  The  patient presents now for operative treatment, having failed conservative  management.  Informed consent was obtained.   DESCRIPTION OF PROCEDURE:  After an adequate level of anesthesia was  achieved, the patient was positioned supine on the operating room table.  We  started on the left knee first, we ahead and utilized a lateral post,  sterilely prepped and draped the left knee in the usual manner.  We entered  the knee arthroscopically using standard arthroscopic portals, superolateral  outflow, anterolateral scope, and anteromedial working portal, which were  all created in similar fashion.  With infiltration of the skin with 0.25%  Marcaine with epinephrine, followed by incision with the 11-blade scalpel,  introduction of the cannula into the joint using blunt obturators,  diagnostic arthroscopy revealed minimal patellofemoral chondromalacia,  medial and lateral gutters fairly spotty, there was no medial plaque that  was noted.  The medial compartment was entered; there was noted to be a  complex posteromedial meniscus tear.  This was debrided back  to stable  meniscal tissue.  In addition, there was significant chondromalacia, grade 2  and grade 3 changes, with fibrillation and loose cartilage.  We went ahead  and performed a chondroplasty to smooth that down.  The ACL had some  degenerative changes; we went ahead and debrided all loose fibers, but in  substance, the ACL was intact and PCL looked good.  The lateral compartments  were pristine as far as articular cartilage goes, but there was an anterior  horn of lateral meniscus tear with loose tissue in the notch.  We went ahead  and performed the anterolateral partial meniscectomy.  The posterior aspect  of the lateral meniscus was normal.  Following the medial and lateral  meniscectomies and abrasion chondroplasty of the medial femoral condyle, we   concluded the knee portion of the surgery with suturing of the wounds with 4-  0 Monocryl followed by Steri-Strips and intra-articular injection with 0.25%  Marcaine with epinephrine followed by application of a compressive bandage.  We then sat the patient up in a beach chair position and padded all  neurovascular structures appropriately.  We went ahead and examined the  right shoulder under anesthesia.  The patient had minimal stiffness with  forward elevation of about 140-160.  Cross arm adduction and internal  rotation was a little tight, indicating some posterior capsular tightness.  There was negative sulcus, negative anterior posterior drawer; external  rotation was out to about 90 degrees, indicating a likely subscapularis  problem.  At this point, we went ahead and sterilely prepped and draped the  right shoulder, entered the shoulder arthroscopically using standard  portals.  Anterior, posterior, and lateral portals were created in a similar  fashion with infiltration of the skin with 0.25% Marcaine with epinephrine  followed by an incision with the 11-blade scalpel, introduction of the  cannula into the joint using blunt obturators.  Diagnostic arthroscopy  revealed significant tearing in the superior labrum with evidence of the  biceps being chronically torn.  We then performed a debridement of the  superior labrum back to stable labral tissue.  Essentially, she had no  labrum following this.  The glenohumeral articular cartilage showed some  mild changes consistent with arthritis, permanently central in the glenoid.  The humeral head looked to be in decent condition with some grade 2 changes.  The subscapularis appeared to be torn and initially it appeared that there  may be some subscapularis tendon left within the joint that I could see.  I  went in and tried to free that up, utilizing an ArthroCare unit to perform a rotator interval release and to skeletonize the  subscapularis tendon to help  with repair.  The rotator cuff was torn and retracted to roughly the glenoid  and this appeared to involve all of the tendons of the rotator cuff.  We  reversed the scope, looking posteriorly.  The posterior labrum looked to be  okay, the posterior cuff also torn.  The scope was placed in the subacromial  space through a bursectomy that was performed, followed by minimal  acromioplasty out laterally; we did not want to do anything to the CA  ligament once it was left alone.  It did appear to be fairly diminutive, but  again, we did nothing to the CA ligament, trying to keep that coracoacromial  arch intact, and again the cuff was retracted to the mid-joint.  About at  this point, we went ahead and made an incision starting at  the anterolateral  border of the acromion and extending down to about 4 cm.  Dissection was  carried down through subcutaneous tissues, split the deltoid to give [place]  access to the humeral head.  We noted there to be some prominence about the  greater tuberosity and lesser tuberosity; we flattened those down.  The  biceps was gone.  The subscapularis was not visible at all in the wound, and  we made multiple attempts, and we were all the way over to conjoined tendon,  which was retracted, to see if we could see anything left of the  subscapularis, and there just was not anything there.  This was quite  disappointing to [place].  The rotator cuff was identified, whip stitched with #2  FiberWire suture, and attempted to be mobilized.  I could not get it out to  the lateral portion of the humerus near the rotator cuff footprint, and thus  we decided not to repair that, given the faction that a repair under tension  would not heal on this lady.  Thus we performed a minimal debridement on  those rotator cuff tendons, just to make sure that there would  be no prominence, anything to get in her joint, and went ahead and closed  her deltoid with  interrupted 0 Vicryl suture followed by 2-0 Vicryl  subcutaneous and 4-0 Monocryl for skin.  Steri-Strips were applied, followed  by a sterile dressing.  The patient was placed in a shoulder sling, having  tolerated the procedure well.           ______________________________  Almedia Balls. Ranell Patrick, M.D.     SRN/MEDQ  D:  05/01/2006  T:  05/01/2006  Job:  [id number]

## 2010-12-06 NOTE — Discharge Summary (Signed)
NAMEAJAH, VANHOOSE NO.:  [id number]   MEDICAL RECORD NO.:  [id number]          PATIENT TYPE:  INP   LOCATION:  3704                         FACILITY:  [facility]   PHYSICIAN:  Kerrin Champagne, M.D.   DATE OF BIRTH:  1923-12-22   DATE OF ADMISSION:  09/20/2004  DATE OF DISCHARGE:  09/26/2004                           DISCHARGE SUMMARY - REFERRING   ADMISSION DIAGNOSES:  1.  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  2.  Hypertension.  3.  Hyperlipidemia.  4.  Hypothyroidism.  5.  Osteoporosis.  6.  Gastroesophageal reflux disease.  7.  Degenerative joint disease of bilateral knees.  8.  Macular degeneration.  9.  Right breast ductal carcinoma in situ.  10. Open angle glaucoma.  11. Depression.  12. Herniated nucleus pulposus at L2-3 with low back pain and right leg      pain.   DISCHARGE DIAGNOSES:  1.  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  2.  Hypertension.  3.  Hyperlipidemia.  4.  Hypothyroidism.  5.  Osteoporosis.  6.  Gastroesophageal reflux disease.  7.  Degenerative joint disease of bilateral knees.  8.  Macular degeneration.  9.  Right breast ductal carcinoma in situ.  10. Open angle glaucoma.  11. Depression.  12. Herniated nucleus pulposus L2-3 with low back pain and right leg pain.  13. Postoperative hypoxia, secondary to atelectasis and narcotic drug use.  14. Constipation.  15. Pancreatic cyst noted on CT scan.  To have further outpatient evaluation      with follow-up CT scan.   PROCEDURE:  On September 20, 2004, the patient underwent a left-sided C3-4 and  left-sided C5-6 [place] foraminotomies microscopically, performed by Dr.  Kerrin Champagne, assisted by Wende Neighbors, P.A., under general  anesthesia.   CONSULTATIONS:  Dr.  Doylene Canning. [facility].   HISTORY OF PRESENT ILLNESS:  The patient is a 77 year old female with severe  neck pain and radiation in to the left upper extremity, with numbness and  pain in the left shoulder.  Extensive evaluations including MRI scans have  demonstrated left-sided C4-5 foraminal stenosis associated with a small disk  protrusion and spondylosis changes.  C5-6 with left-sided foraminal  narrowing was also noted.  Her pain pattern has consistently been in the C5-  C6 level.  She has had notable weakness with the left biceps, and also  numbness and pain in the left shoulder preoperatively.  She has not gotten  relief with conservative treatment including select nerve root block and  narcotic analgesics.  It was felt that she would require surgical  intervention, and she was admitted for the procedure, as stated above.   HOSPITAL COURSE:  The patient tolerated the procedure under general  anesthesia, without complications.  Postoperatively she did have some  decreased discomfort in the left upper extremity but continued  to have left  shoulder pain.  She had nausea on her first postoperative day.  She  complained of increasing pain, and her pain medications were increased to  OxyContin and Oxy IR.  Neurovascular and motor function of both upper  extremities remained intact throughout the hospital stay.  Unfortunately on  September 23, 2004, the patient had an episode of confusion and disorientation.  She was noted to be hypoxic.  A medical consultation was obtained.  She  underwent a full evaluation, including a chest x-ray, ABG's, a cardiac  evaluation, and was also placed on the telemetry unit.  She was noted to  have no pneumonia, and bibasilar atelectasis was noted.  She was placed on  oxygen and her narcotic analgesics discontinued.  She also underwent a chest  CT which did not show any signs for pulmonary embolism; however, did show a  1 cm cyst in  the pancreas.  When she was on the telemetry floor and was  oxygenating better, she received physical therapy for ambulation and gait  training.  She was able to ambulate as much as 30 feet.  The nursing staff  reported the patient getting up and down to the bathroom with minimal  assistance.  She did have some difficulty with dressing herself and bathing  herself; however, was able to feed herself.  The neurovascular and motor  function of her upper extremities remained intact throughout the remainder  of the hospital stay.  Her wound was healing well without drains or  erythema.  Ice packs were used to the posterior neck as well as to the  shoulder, to help with her pain control.  She was only allowed Tylenol and  hydrocodone after her incident of hypoxia.  These pain medications did not  seem to be very adequate.  She continued to have neurogenic-type pain in the  left upper extremity.  We will prescribe Lyrica for her, to be started at  the nursing home.   On September 26, 2004, the patient was stable from the medical standpoint.  Orthopedically she was stable as well, and was able to be transferred back  to [facility], and will be placed in their skilled  facility.   LABORATORY DATA:  Admission labs include a CMET which was within normal  limits.  Coagulation study within normal limits.  A CBC with WBC's 6.9,  hemoglobin 15.2 and hematocrit 43.9, with normal differential.  A urinalysis  was normal on admission.  Repeat laboratory studies on September 23, 2004,  showed chemistries showing a potassium of 3.1, glucose 110.  The remaining  values normal.  Troponin I level on September 23, 2004, at 0.08, with CK and CK-  MB normal.  ABG's on September 23, 2004, showing a pO2 of 50.7, pCO2 of 50.4,  bicarbonate 31.9, base excess 7.3.  Repeat CBC on September 23, 2004, with a hemoglobin of 13.4, hematocrit 38.2.  The remaining values normal.  D-dimer  on September 23, 2004, of 0.95.  On March  11th was 0.99.  A urinalysis on March  10th was normal.   A chest x-ray on the chart for preoperative use was from March 04, 2004,  showing borderline cardiomegaly.  Density projecting over the spine  evaluated previously, characterized as presenting spondylosis and spurring,  similar appearance to prior examination on July 29, 2002.  A chest CT  during the hospital stay is as stated above.   An  electrocardiogram on admission:  Normal sinus  rhythm.   PLAN:  The patient will be transferred to the [facility].  There she may be ambulatory as tolerated.  She may have  assistance with ambulation if needed.  The limitations with her neck include  no overhead lifting.  She will minimize the rotation of her neck as well.  She may move the neck as tolerated.  She has a soft cervical collar which  may be used on an as-needed basis for comfort.  If she is having increasing  spasms and pain posteriorly, the soft collar dose help to alleviate some of  the stress on this musculature.  Ice packs are helpful on the posterior  aspect of the neck, as well as the left shoulder.  This may be used on an as-  needed basis.  She should avoid lifting over 5 to 10 pounds with either  upper extremity at this point.  The wound of her posterior neck has Steri-  Strips overlying a subcuticular stitch.  The Steri-Strips should be allowed  to come off on their own.  She is allowed to shower and get this wound wet,  as long as there is no drainage.   The patient has had difficulty with constipation in the hospital, which will  be need to be treated at the nursing facility.  Thus far she does not have  an ileus; however, will need to be treated quite aggressively for her  constipation to avoid this.   The patient has been noted to have a pancreatic cyst on the CT scan done at  the hospital.  The attending internal medicine physician has spoken with her  and with her son, as  well as with Dr. Ann Maki T. Stoneking in regards to this  cyst.  She will have followup for this in three to six months.  A CT should  be done and set up through Dr. Pete Glatter.   DISCHARGE MEDICATIONS:  1.  Protonix 40 mg daily.  2.  Vasotec 12.5 mg daily.  3.  Synthroid 88 mcg daily.  4.  Desyrel 50 mg p.o. q.h.s.  5.  Xalatan eye drops 0.005% solution, both eyes q.h.s.  6.  Femora 2.5 mg p.o. daily.  7.  Calcium carbonate or vitamin D p.o. t.i.d.  8.  Ocuvite p.o. b.i.d.  9.  Potassium chloride 20 mEq p.o. daily.  10. Lasix 40 mg p.o. daily.  11. Colace one p.o. b.i.d.  12. Compazine 5 mg p.o. q.8h. p.r.n.  13. Tylenol Extra Strength, one to two q.6h. as needed for pain.  14. Imodium 2 mg q.4h. p.r.n. diarrhea.  15. Capsaicin topical p.r.n.  16. Genteel eye drops p.r.n.  17. Laxative of choice as needed.  18. Enema of choice as needed. 19. For pain we recommend that the patient be treated with hydrocodone 5/325      mg, one to two q.4-6h. p.r.n. severe pain.  20. Skelaxin 800 mg, one q.8h. as needed for muscle spasms.  21. We would like for the patient to be started on Lyrica 50 mg p.o. b.i.d.      There should be no substitution for the Lyrica.  If this medication      cannot be obtained, please notify Dr. Barbaraann Faster office.   FOLLOWUP:  The patient should follow up with Dr. Otelia Sergeant in approximately  seven to 10 days.  An appointment can be made by calling #[phone].  If  there are questions or concerns regarding her orthopedic  status prior to  that time, please notify Dr. Barbaraann Faster office.  From a pulmonary standpoint,  the patient has been utilizing oxygen via nasal cannula on an as-needed  basis.  Her oxygen saturations have been remaining around 96%-100% on room  air.   DIET:  She should continue on a regular diet.   ACTIVITY:  The patient should be encouraged to ambulate as much as  tolerated, and to be out of bed as much as tolerated.   CONDITION ON DISCHARGE:   Stable.      SMV/MEDQ  D:  09/26/2004  T:  09/26/2004  Job:  [id number]

## 2010-12-06 NOTE — Op Note (Signed)
NAMELATESSA, TILLIS NO.:  [id number]   MEDICAL RECORD NO.:  [id number]          PATIENT TYPE:  INP   LOCATION:  NA                           FACILITY:  [facility]   PHYSICIAN:  Kerrin Champagne, M.D.   DATE OF BIRTH:  07/22/1923   DATE OF PROCEDURE:  09/20/2004  DATE OF DISCHARGE:                                 OPERATIVE REPORT   PREOPERATIVE DIAGNOSIS:  Left C4-5 foraminal stenosis with small herniated  nucleus pulposus causing left C5 nerve root compression and left C5-6  foraminal stenosis.   POSTOPERATIVE DIAGNOSIS:  Left C4-5 foraminal stenosis with small herniated  nucleus pulposus causing left C5 nerve root compression and left C5-6  foraminal stenosis.   PROCEDURE:  Left-sided C4-5 and left-sided C5-6 [place] foraminotomies.  Utilizing the operating room microscope.   SURGEON:  Kerrin Champagne, M.D.   ASSISTANT:  Maud Deed, [facility]   ANESTHESIA:  GOT.   ANESTHESIOLOGIST:  Dr. Jean Rosenthal.   ESTIMATED BLOOD LOSS:  75 mL.   DRAINS:  Foley to straight drain.   BRIEF CLINICAL HISTORY:  The patient is a 77 year old female who is a  resident of [facility].  She complains of severe neck pain  with radiation to the left arm with attendant numbness, pain into her left  shoulders.  She has undergone extensive workup with the MRI demonstrating a  left-sided C4-5 foraminal stenosis associated with a small protrusion and  spondylosis changes.  C5-6 with left-sided foraminal narrowing.  Pain  pattern is C5-C6.  She has weakness in her biceps and she had numbness and  pain into the left shoulder.   Selective nerve root block only temporized the discomfort.   She is brought the operating room to undergo left-sided foraminotomy.  She  also has history of lumbar disk protrusion which will require surgical  intervention; however, her cervical spine condition precludes lumbar surgery  at this point.   INTRAOPERATIVE FINDINGS:  Left C4-5 foraminal  entrapment affecting primarily  the left C5 nerve root.  Mild foraminal narrowing, left C5-6.   DESCRIPTION OF PROCEDURE:  After adequate general anesthesia, the patient  had a Foley catheter placed, standard preoperative antibiotics.  She was  rolled into a prone position and the face and neck held on the well-padded  Mayfield horseshoe in slight flexion, chin padded and maintained well-away  from the hard surfaces.  Arms at the side, also well-padded.  TED hose, knee-  high.  All pressure points well-padded on the lower extremities.  Standard  prep over the posterior aspect of the neck with DuraPrep solution, draped in  the usual manner, iodine Vi-Drape was used. The incision approximately 2.5  to 3 cm in length in the midline at the expected C5 and C6 levels.  Incision  carried through the skin and subcu layers after infiltration of Marcaine  0.5% with 1:200,000 epinephrine.  It was carried down to the posterior  aspect of the nuchal ligament of the neck.  The left side ligament was  resected off of the C4-C5 levels and C6 levels.  Clamp  placed on the spinous  process of C4 initially and radiograph demonstrated clamp at C4.  However,  suture placed, but later fell off and required a repeat radiograph with a  clamp on the spinous process of C3 and C4, a new suture placed onto the  posterior aspect of spinous process of C4 for continued identification.  The  post posterior paracervical muscle was then carefully removed off the  posterior aspect of the lamina of C4, C5 and C6, and then a curved Mayo  scissors used to incise and cut the paracervical muscles from the inferior  aspect of the lamina of C4 and C5, exposing the C4-5 and C5-6 inferior  lamina and interlaminar region posteriorly as well as the medial aspect of  facet here.  Bleeders were controlled using bipolar electrocautery and  monopolar electrocautery.  Boss retractor inserted, a high-speed bur brought  in to field and after  bringing it in the operating room, microscope draped  sterilely.  Note that the initial procedure exposure obtained using a  headlight and loupe magnification.  With the [facility] retractors in place  and the operating room microscope brought into the field, a high-speed bur  was then used to remove a small portion of the medial aspect of facet at the  L4-5 level and at the C4-5 and C5-6 levels, performing a partial medial  facetectomy at 4-5 and at 5-6, carefully thinning these areas, maintaining  the ligamentum flavum intact.  A 1-mm Kerrison was able to be introduced,  removing a small portion of the inferior aspect and lateral portion of the  lamina of C4 and of C5 inferiorly as well and a portion of the superior  aspect lamina of C5 and C6.  Medial facetectomy was then performed using 1-  and 2-mm Kerrisons, removing bone that was pressing on the nerve roots  causing neural foraminal compression at the C4-5 level and at the C5-6  level.  At the C4-5 level, epidural veins were carefully elevated and  cauterized, divided, removing neurovascular leash overlying the C5 nerve  root that was causing compression here.  Following this then, the neural  foramen was felt to be well-decompressed.  A nerve hook could be easily  passed out the neural foramen, demonstrating well-decompressed foramen and 5  nerve root.  Similarly, at the C6 level, at C5-6, medial facetectomy was  performed out lateral until the nerve root was felt to be completely free  and nerve hook could be passed out along the nerve root, demonstrating that  it was freely exiting the neural foramen without difficulty.  With this  then, bleeders were controlled using bipolar electrocautery, Gelfoam placed  over the laminotomy region, bone wax applied over the bleeding cancellous  bone surfaces and excess bone wax removed.  Finally, reevaluating the level, it was noted again a second suture ; this appeared that it had been placed   for marking this level so that a final third radiograph was obtained with a  clamp at the most superior lamina at the upper edge of the expected C4-5  foraminotomy.  This clamp was found to be at the C4 level.  Additional clamp  placed at the C3 level above this segment, identifying C3's spinous process  as well and again demonstrating that C4 was her expected level, most  superior aspect of this exposure and the procedure.  Following this,  irrigation was performed and then removed.  No active bleeding was found to  be present in the  depths incision.  The ligamentum nuchal material  approximated with interrupted 0 Ethilon suture, deep subcu layers  approximated with interrupted 0 Vicryl sutures,  the more superficial layers  with interrupted 2-0 Vicryl sutures, skin closed with a running subcu stitch  of 4-0 Vicryl, tincture of Benzoin and Steri-Strips applied, 4x4's affixed  to the skin with Hypafix tape.  The patient then returned to a supine  position, reactivated, extubated and returned to the recovery room in  satisfactory condition. All instrument and sponge counts were correct.      JEN/MEDQ  D:  09/20/2004  T:  09/21/2004  Job:  [id number]

## 2010-12-06 NOTE — Op Note (Signed)
NAME:  [name], ROTHSCHILD                          ACCOUNT NO.:  [id number]   MEDICAL RECORD NO.:  [id number]                   PATIENT TYPE:  AMB   LOCATION:  DSC                                  FACILITY:  [facility]   PHYSICIAN:  Leonides Grills, M.D.                  DATE OF BIRTH:  [birth year]   DATE OF PROCEDURE:  03/16/2004  DATE OF DISCHARGE:                                 OPERATIVE REPORT   PREOPERATIVE DIAGNOSIS:  1.  Right lateral great toe proximal phalanx spur.  2.  Right second toe medial proximal phalanx spur.  3.  Right second toe middle phalanx head spur.   POSTOPERATIVE DIAGNOSIS:  1.  Right lateral great toe proximal phalanx spur.  2.  Right second toe medial proximal phalanx spur.  3.  Right second toe middle phalanx head spur.   OPERATION:  1.  Partial excision right great toe proximal phalanx head.  2.  Right second toe partial excision proximal phalanx head.  3.  Right second toe middle phalanx partial excision head.   ANESTHESIA:  General endotracheal tube with popliteal block.   SURGEON:  Leonides Grills, M.D.   ASSISTANT:  Lianne Cure, P.A.-C.   ESTIMATED BLOOD LOSS:  Minimal.   TOURNIQUET TIME:  Approximately 20 minutes.   COMPLICATIONS:  None.   DISPOSITION:  Stable to the PR.   INDICATIONS FOR PROCEDURE:  This is a 77 year old female who has had kissing  corns inbetween the first and second toes.  Despite wearing pads and  variations in shoe wear, she was unable to relieve her symptoms.  She was  consented to the above procedure.  All risks which include infection,  neurovascular injury, recurrence of spur formation and pain, and deformity  were all explained, questions were encouraged and answered.   OPERATION:  The patient was brought to the operating room and placed in  supine position.  After adequate general endotracheal anesthesia was  administered with popliteal block as well as Ancef 1 gram IV piggyback, the  right lower extremity was then  prepped and draped in a sterile manner, we  placed a thigh tourniquet, the limb was gravity exsanguinated, and the  tourniquet was elevated to 290 mmHg.  A longitudinal incision over the  lateral aspect of the right great toe IP joint was then made.  Dissection  was carried down directly to the proximal phalanx head.  Soft tissue was  elevated on either side of the head of the proximal phalanx and with the  rongeur, this was then removed.  This was palpated through the skin and felt  to be adequately decompressed.  We then made a separate incision over the  second toe dorsal medial aspect of the PIP joint.  Dissection was carried  down to bone.  The soft tissue was elevated around the proximal phalanx head  and partial excision of the head was then done  with a rongeur.  A  longitudinal incision was then made on the dorsal medial aspect of the  second toe to the IP joint.  Dissection was carried down to bone.  Soft  tissue was elevated on either side of the head and partial excision of the  head was then performed with a rongeur.  This was then palpated through the  skin and was felt to be excellent in regards to complete decompression.  The  tourniquet was deflated, hemostasis was obtained.  The wound was closed with  4-0 nylon suture.  Sterile dressing was applied, a hard sole shoe was  applied.  The patient was stable to the PR.                                               Leonides Grills, M.D.    PB/MEDQ  D:  03/16/2004  T:  03/16/2004  Job:  [id number]

## 2010-12-06 NOTE — Op Note (Signed)
NAME:  [name], [name]                          ACCOUNT NO.:  [id number]   MEDICAL RECORD NO.:  [id number]                   PATIENT TYPE:  AMB   LOCATION:  DSC                                  FACILITY:  [facility]   PHYSICIAN:  Currie Paris, M.D.           DATE OF BIRTH:  August 03, 1923   DATE OF PROCEDURE:  07/30/2002  DATE OF DISCHARGE:                                 OPERATIVE REPORT   [facility]S [phone]   PREOPERATIVE DIAGNOSIS:  Carcinoma, right breast, upper inner quadrant.   POSTOPERATIVE DIAGNOSIS:  Carcinoma, right breast, upper inner quadrant.   OPERATION PERFORMED:  Needle guided right partial mastectomy with blue dye  injection and sentinel lymph node dissection.   SURGEON:  Currie Paris, M.D.   ANESTHESIA:  General.   INDICATIONS FOR PROCEDURE:  The patient is a 77 year old recently found to  have a small right breast cancer which had some invasive component but a  DCIS component as well.  It was not palpable.  After discussion of  alternatives with the patient we elected to proceed with a needle guided  excision of the cancer with blue dye injection and sentinel node dissection.   DESCRIPTION OF PROCEDURE:  Patient in holding area and had no further  questions.  Her right arm was marked as the operative side.  She already had  a guidewire placed and the mammograms were reviewed and the positioning  confirmed and she had already had her radionuclide injection in the right  breast.   The patient was taken to the operating room and after satisfactory general  anesthesia, the right breast was prepped and draped. I injected some  Lymphazurin blue subareolarly on the right side and massaged that in.  I  approached the tumor first and the guidewire entered in the medial portion  of the breast and tracked laterally almost directly horizontally and I made  a transverse incision directly straight from the guidewire laterally.  I  divided about 1 cm of subcutaneous tissue  and then went superiorly until I  thought I was well past where the tumor would be and down to the chest wall.  Then I worked around medially since the guidewire was tracking laterally  down to the chest wall, then inferiorly so that I got under the areolar  complex to make sure we got tissue going all the way up close out to the  nipple and then well lateral as I pulled all this tissue up to divided it.  This was sent for specimen mammogram.   Bleeders were controlled with cautery.  Once everything was dry I placed a  pack.  Attention was then turned to the axilla.  Using the Neoprobe I had  trouble identifying any counts at all but did find one area that had counts  of around 12 and every place else was around 0.  I made a transverse  incision over that  which was somewhat anterior and basically at the bottom  of the axillary hair line.  Dividing subcutaneous tissues into the axilla, I  saw absolutely no absolutely blue dye, no lymphatic or blue vessels or  anything that had any hint of blue in it.  By this time we had waited 10 to  15 minutes from blue dye injection.   Using Neoprobe I found a little hot area that was a little bit higher up,  very anterior just under the pectoralis but just above the pectoral vessels,  the vessels to the pec major.  A little dissection of lymphatic tissue here  I was able to identify small lymph node, perhaps 4 mm and excise this.  This  actually had counts up to about 65.  At this point using the Neoprobe, there  were still some counts in this very same vicinity.  I dissected a little bit  further and was able to identify a second node, slightly larger than the  first and excised it.  It had counts up to about 25 or 30.  Once these two  were out, counts dropped to 0 to 5 throughout the entire axillary area.  Further dissection looking for blue dye was unsuccessful and there were no  palpable nodes noted.   While waiting for pathology and specimen  mammography, I closed both  incisions starting with the axilla using some 3-0 Vicryl to close the  subcutaneous tissues and 4-0 Monocryl subcuticular.  Attention was then  turned back to the breast and this was irrigated.  I put a larger clip on  the chest wall muscle and another one at the bottom of the subcu to mark  anterior and posterior levels of the cavity.  Four clips were used to mark  superior, inferior, medial and lateral aspects.  The breast itself was not  closed but I used a 3-0 Vicryl to close the subcutaneous and then 4-0  Monocryl subcuticular on the skin.  I injected Marcaine in both sites as we  worked and at the end to try to help with postoperative analgesia.   Radiology reported that the abnormality was contained right in the middle of  the specimen.  Pathology reported the two nodes were negative for obvious  tumor.   The patient tolerated the procedure well.  There were no operative  complications.  All counts were correct.                                                Currie Paris, M.D.    CJS/MEDQ  D:  07/30/2002  T:  07/30/2002  Job:  [id number]   cc:   Alfonse Alpers. Dagoberto Ligas, M.D.  [address], [address]  [place]  [place] [address]  Fax: [phone]

## 2010-12-06 NOTE — Op Note (Signed)
NAME:  [name], [name]                          ACCOUNT NO.:  [id number]   MEDICAL RECORD NO.:  [id number]                   PATIENT TYPE:  AMB   LOCATION:  ENDO                                 FACILITY:  [facility]   PHYSICIAN:  Graylin Shiver, M.D.                DATE OF BIRTH:  June 27, 1924   DATE OF PROCEDURE:  06/09/2003  DATE OF DISCHARGE:                                 OPERATIVE REPORT   PROCEDURE:  Colonoscopy with biopsy.   INDICATIONS FOR PROCEDURE:  This patient is a 77 year old female with  intermittent  rectal bleeding and a family history of colon cancer and some  incontinence of stool. Informed consent was obtained after explanation of  the risks of bleeding, infection and perforation.   PREMEDICATIONS:  Fentanyl 80 micrograms IV, Versed 8 mg IV.   DESCRIPTION OF PROCEDURE:  With the patient in the left lateral decubitus  position a rectal examination  was performed and no masses were felt. There  was some lax anal sphincter tone.   The colonoscope was inserted into the rectum and advanced  around the colon  to the cecum. The cecal landmarks were identified. The cecum and ascending  colon were normal. The transverse colon was normal. The descending colon,  sigmoid showed some scattered diverticula. In the sigmoid there were 2 small  3-mm polyps which were biopsied off with cold forceps. The rectum looked  normal.  She tolerated the procedure well without complications.   IMPRESSION:  1. Diverticulosis.  2. Two small polyps in the sigmoid.   PLAN:  The pathology will be checked.                                               Graylin Shiver, M.D.    Germain Osgood  D:  06/09/2003  T:  06/09/2003  Job:  [id number]   cc:   Hal T. Stoneking, M.D.  [address] [place], [place] [address]  Fax: [phone]

## 2010-12-28 ENCOUNTER — Inpatient Hospital Stay (HOSPITAL_COMMUNITY)
Admission: EM | Admit: 2010-12-28 | Discharge: 2011-01-02 | DRG: 872 | Disposition: A | Discharge status: Skilled Nursing Facility | Payer: Medicare Other | Attending: Internal Medicine | Admitting: Internal Medicine

## 2010-12-28 ENCOUNTER — Emergency Department (HOSPITAL_COMMUNITY): Payer: Medicare Other

## 2010-12-28 DIAGNOSIS — Z96659 Presence of unspecified artificial knee joint: Secondary | ICD-10-CM

## 2010-12-28 DIAGNOSIS — H409 Unspecified glaucoma: Secondary | ICD-10-CM | POA: Diagnosis present

## 2010-12-28 DIAGNOSIS — K219 Gastro-esophageal reflux disease without esophagitis: Secondary | ICD-10-CM | POA: Diagnosis present

## 2010-12-28 DIAGNOSIS — R7309 Other abnormal glucose: Secondary | ICD-10-CM | POA: Diagnosis present

## 2010-12-28 DIAGNOSIS — G8929 Other chronic pain: Secondary | ICD-10-CM | POA: Diagnosis present

## 2010-12-28 DIAGNOSIS — H3530 Unspecified macular degeneration: Secondary | ICD-10-CM | POA: Diagnosis present

## 2010-12-28 DIAGNOSIS — N179 Acute kidney failure, unspecified: Secondary | ICD-10-CM | POA: Diagnosis present

## 2010-12-28 DIAGNOSIS — Z853 Personal history of malignant neoplasm of breast: Secondary | ICD-10-CM

## 2010-12-28 DIAGNOSIS — D696 Thrombocytopenia, unspecified: Secondary | ICD-10-CM | POA: Diagnosis present

## 2010-12-28 DIAGNOSIS — A4151 Sepsis due to Escherichia coli [E. coli]: Principal | ICD-10-CM | POA: Diagnosis present

## 2010-12-28 DIAGNOSIS — N390 Urinary tract infection, site not specified: Secondary | ICD-10-CM | POA: Diagnosis present

## 2010-12-28 DIAGNOSIS — F05 Delirium due to known physiological condition: Secondary | ICD-10-CM | POA: Diagnosis present

## 2010-12-28 DIAGNOSIS — Z96619 Presence of unspecified artificial shoulder joint: Secondary | ICD-10-CM

## 2010-12-28 LAB — URINALYSIS, ROUTINE W REFLEX MICROSCOPIC
Nitrite: POSITIVE — AB
Specific Gravity, Urine: 1.021 (ref 1.005–1.030)
Urobilinogen, UA: 0.2 mg/dL (ref 0.0–1.0)

## 2010-12-28 LAB — DIFFERENTIAL
Basophils Absolute: 0 10*3/uL (ref 0.0–0.1)
Eosinophils Absolute: 0 10*3/uL (ref 0.0–0.7)
Lymphocytes Relative: 3 % — ABNORMAL LOW (ref 12–46)
Monocytes Relative: 12 % (ref 3–12)
Neutrophils Relative %: 85 % — ABNORMAL HIGH (ref 43–77)
Smear Review: ADEQUATE

## 2010-12-28 LAB — URINE MICROSCOPIC-ADD ON

## 2010-12-28 LAB — COMPREHENSIVE METABOLIC PANEL
ALT: 11 U/L (ref 0–35)
BUN: 32 mg/dL — ABNORMAL HIGH (ref 6–23)
Calcium: 9.9 mg/dL (ref 8.4–10.5)
GFR calc Af Amer: 48 mL/min — ABNORMAL LOW (ref >60)
Glucose, Bld: 210 mg/dL — ABNORMAL HIGH (ref 70–99)
Sodium: 138 mEq/L (ref 135–145)
Total Protein: 6.1 g/dL (ref 6.0–8.3)

## 2010-12-28 LAB — PROCALCITONIN: Procalcitonin: 2.31 ng/mL

## 2010-12-28 LAB — CBC
Hemoglobin: 12.5 g/dL (ref 12.0–15.0)
RBC: 3.76 MIL/uL — ABNORMAL LOW (ref 3.87–5.11)

## 2010-12-28 LAB — LACTIC ACID, PLASMA: Lactic Acid, Venous: 2.5 mmol/L — ABNORMAL HIGH (ref 0.5–2.2)

## 2010-12-28 NOTE — H&P (Signed)
[name], [name] NO.:  [id number]  MEDICAL RECORD NO.:  [id number]  LOCATION:  WLED                         FACILITY:  [facility]  PHYSICIAN:  Andreas Blower, MD       DATE OF BIRTH:  [birth year]  DATE OF ADMISSION:  12/28/2010 DATE OF DISCHARGE:                             HISTORY & PHYSICAL   PRIMARY CARE PHYSICIAN:  Hal T. Stoneking, MD  CHIEF COMPLAINT:  Fever.  HISTORY OF PRESENT ILLNESS:  Ms. Schiffer is an 77 year old Caucasian female with history of hyperlipidemia, GERD, hypertension, history of breast cancer status post lumpectomy in 2004, macular degeneration, glaucoma, hypothyroidism who presents with fever.  The patient at this time is a poor historian.  Most of the history was gathered from talking to the ER physician. The patient lives at [facility].  The staff found her with high fever.  The patient was brought to the ER.  The patient does not know how long she had the fever, is not able to tell me anything else.  All she can tell me is that she has a fever.  The patient denies any pain.  Denies any chest pain, shortness of breath.  Denies any abdominal pain.  Denies any headache.  The patient had a chest x-ray which shows hypoventilation with bibasilar atelectasis.  UA shows that she has a positive nitrite, large leukocytes, and many bacteria.  The patient is to be admitted for management of urosepsis.  REVIEW OF SYSTEMS:  All systems were reviewed with the patient was positive as per HPI, otherwise all other systems are negative.  SOCIAL HISTORY:  Could not be obtained from the patient, due to her confusion.  From past records, she does not smoke, does not drink any alcohol from past records, is widowed. Currently, resides at [facility].  FAMILY HISTORY:  Could not be obtained from the patient due to her confusion. Noncontributory due to age.  HOME MEDICATIONS: 1. Flonase 2 sprays daily. 2. Methocarbamol 500 mg every 6 hours as  needed for muscle spasm. 3. Colace 200 mg daily as needed for constipation. 4. Hydroxyzine 25 mg p.o. daily at bedtime. 5. Percocet 5/325 two tablets p.o. 3 times a day as needed for pain. 6. Omeprazole 20 mg p.o. twice daily. 7. Calcium acetate 1 tablet p.o. twice daily with meals. 8. Vitamin D3 400 units p.o. daily. 9. Potassium chloride 20 mEq p.o. daily. 10.Levothyroxine 88 mcg p.o. q.a.m. 11.Hydrochlorothiazide 25 mg p.o. q.a.m. 12.Enalapril 5 mg p.o. q.a.m. 13.Aspirin 81 mg p.o. daily. 14.Amlodipine 5 mg p.o. q.a.m. 15.Alphagan 0.1% solution both eyes 1 drop p.o. q.a.m. 16.Alendronate 70 mg p.o. weekly. 17.Lidocaine patch half a patch to full patch every 12 hours to each     shoulder and 1 patch to right knee.  PAST MEDICAL HISTORY: 1. History of hyperlipidemia. 2. GERD. 3. Chronic low back pain. 4. History of the breast cancer, status post lumpectomy in 2004. 5. Hypertension. 6. History of osteoarthritis. 7. History of macular degeneration and glaucoma. 8. History of osteoporosis. 9. Hypothyroidism. 10.History of bilateral total shoulder arthroplasty x2. 11.Cervical spine surgery. 12.Right total knee replacement. 13.C-section x3. 14.Left knee arthroscopic surgery. 15.Bilateral cataract surgeries. 16.Esophageal  dilatation. 17.History of post patellar fracture.  PHYSICAL EXAMINATION:  VITAL SIGNS:  Temperature was 102.5, blood pressure 122/54, heart rate 102, respirations 11, saturating at 100% on few liters of oxygen. GENERAL:  The patient was awake, did not appear to be in acute distress was lying in bed.  She was warm to touch, was not oriented to self, location, or time. HEENT:  Extraocular motions were intact.  Pupils equal, round.  Had slightly dry mucous membranes. NECK:  Supple. HEART:  Regular with S1 and S2. LUNGS:  Clear to auscultation bilaterally. ABDOMEN:  Soft, nontender, nondistended.  Positive bowel sounds. EXTREMITIES:  The patient has good  peripheral pulses with trace edema. NEUROLOGIC:  Could not be assessed due to patient's compliance.  RADIOLOGY/IMAGING:  The patient had portable chest x-ray which showed hypoventilation with mild basilar atelectasis.  LABORATORY DATA:  CBC shows white count 15.5, hemoglobin 12.5, hematocrit 36.4, platelet count clumped.  Electrolytes are normal except BUN of 32, creatinine 1.28.  Liver function test is normal.  Lactic acid is 2.5.  UA was positive for nitrites, large leukocytes, many bacteria.  ASSESSMENT AND PLAN: 1. Fever, likely due to urinary tract infection. 2. Urinary tract infection.  The patient will be empirically started     on vancomycin and Zosyn.  After based on how the fever curve is in     the morning, consider discontinuing vancomycin and transitioning     Zosyn to ceftriaxone, follow up the urine cultures. 3. Sepsis secondary to urinary tract infection.  Lactic acid elevated     at 2.5.  We will admit the patient to step-down.  If the patient     improves, consider transitioning the patient to the regular floor. 4. Dehydration, likely due to fever, urinary tract infection, and     sepsis.  We will gently hydrate her. 5. Leukocytosis, likely due to sepsis. 6. Hyperglycemia.  I will check her hemoglobin A1c in the morning.     The patient is not a diabetic.  Continue to trend blood sugars. 7. Gastroesophageal reflux disease.  Continue the patient on PPI. 8. Chronic low back pain and shoulder pain.  Continue lidocaine patch. 9. Hypertension.  Holding hydrochlorothiazide as the patient is     dehydrated.  We will continue amlodipine and enalapril with hold     parameters of systolic blood pressure less than 110. 10.History of osteoarthritis.  Continue home pain medications. 11.Hypothyroidism.  Continue Synthroid. 12.Acute delirium, again likely due to fever, urinary tract infection,     sepsis, monitor for now. 13.Acute renal failure from dehydration versus chronic  kidney disease     stage 2, has a baseline creatinine 1.1, mildly elevated.  I suspect     the patient most likely has mild renal insufficiency from     dehydration rather than from acute renal failure. 14.Code status.  The patient is full code.  Given the patient's     delirium, could not be fully addressed at this time.  I attempted     to contact the patient's son at [phone] and [phone], however,     went directly to voice mail. 15.Prophylaxis.  Lovenox for deep vein thrombosis prophylaxis.  Time spent on admission talking to the patient and coordinating care was 1 hour.     Andreas Blower, MD     SR/MEDQ  D:  12/28/2010  T:  12/28/2010  Job:  [id number]  Electronically Signed by Wardell Heath Evertt Chouinard  on 12/28/2010 11:27:28 PM

## 2010-12-29 LAB — BASIC METABOLIC PANEL
BUN: 31 mg/dL — ABNORMAL HIGH (ref 6–23)
Chloride: 106 mEq/L (ref 96–112)
Glucose, Bld: 120 mg/dL — ABNORMAL HIGH (ref 70–99)
Potassium: 3.6 mEq/L (ref 3.5–5.1)

## 2010-12-29 LAB — DIC (DISSEMINATED INTRAVASCULAR COAGULATION)PANEL
Fibrinogen: 535 mg/dL — ABNORMAL HIGH (ref 204–475)
Platelets: 78 10*3/uL — ABNORMAL LOW (ref 150–400)
Smear Review: NONE SEEN

## 2010-12-29 LAB — CBC
HCT: 32.8 % — ABNORMAL LOW (ref 36.0–46.0)
Hemoglobin: 10.9 g/dL — ABNORMAL LOW (ref 12.0–15.0)
MCH: 32.7 pg (ref 26.0–34.0)
MCH: 33 pg (ref 26.0–34.0)
MCHC: 33.2 g/dL (ref 30.0–36.0)
MCV: 98.8 fL (ref 78.0–100.0)
Platelets: 87 10*3/uL — ABNORMAL LOW (ref 150–400)
RBC: 3.33 MIL/uL — ABNORMAL LOW (ref 3.87–5.11)
RDW: 13.5 % (ref 11.5–15.5)

## 2010-12-29 LAB — HEMOGLOBIN A1C: Hgb A1c MFr Bld: 5.9 % — ABNORMAL HIGH (ref <5.7)

## 2010-12-30 LAB — CBC
Hemoglobin: 11.1 g/dL — ABNORMAL LOW (ref 12.0–15.0)
Platelets: 73 10*3/uL — ABNORMAL LOW (ref 150–400)
RBC: 3.4 MIL/uL — ABNORMAL LOW (ref 3.87–5.11)
WBC: 19.4 10*3/uL — ABNORMAL HIGH (ref 4.0–10.5)

## 2010-12-30 LAB — BASIC METABOLIC PANEL
CO2: 23 mEq/L (ref 19–32)
Chloride: 108 mEq/L (ref 96–112)
Glucose, Bld: 80 mg/dL (ref 70–99)
Potassium: 3.6 mEq/L (ref 3.5–5.1)
Sodium: 139 mEq/L (ref 135–145)

## 2010-12-30 LAB — URINE CULTURE

## 2010-12-31 LAB — CULTURE, BLOOD (ROUTINE X 2): Culture  Setup Time: 201206140102

## 2010-12-31 LAB — BASIC METABOLIC PANEL
CO2: 23 mEq/L (ref 19–32)
Calcium: 9.3 mg/dL (ref 8.4–10.5)
Chloride: 110 mEq/L (ref 96–112)
Creatinine, Ser: 1.04 mg/dL (ref 0.50–1.10)
Glucose, Bld: 87 mg/dL (ref 70–99)

## 2010-12-31 LAB — CBC
Hemoglobin: 12.6 g/dL (ref 12.0–15.0)
MCH: 31.9 pg (ref 26.0–34.0)
MCV: 97.7 fL (ref 78.0–100.0)
RBC: 3.95 MIL/uL (ref 3.87–5.11)
WBC: 17.9 10*3/uL — ABNORMAL HIGH (ref 4.0–10.5)

## 2011-01-01 LAB — CBC
HCT: 34.7 % — ABNORMAL LOW (ref 36.0–46.0)
HCT: 35.5 % — ABNORMAL LOW (ref 36.0–46.0)
MCH: 32.3 pg (ref 26.0–34.0)
MCHC: 33.8 g/dL (ref 30.0–36.0)
MCV: 96.7 fL (ref 78.0–100.0)
MCV: 97 fL (ref 78.0–100.0)
Platelets: 85 10*3/uL — ABNORMAL LOW (ref 150–400)
Platelets: 90 10*3/uL — ABNORMAL LOW (ref 150–400)
RBC: 3.59 MIL/uL — ABNORMAL LOW (ref 3.87–5.11)
RDW: 13.2 % (ref 11.5–15.5)
RDW: 13.3 % (ref 11.5–15.5)
WBC: 10.4 10*3/uL (ref 4.0–10.5)
WBC: 9.3 10*3/uL (ref 4.0–10.5)

## 2011-01-01 LAB — BASIC METABOLIC PANEL
BUN: 14 mg/dL (ref 6–23)
CO2: 25 mEq/L (ref 19–32)
Calcium: 8.8 mg/dL (ref 8.4–10.5)
Chloride: 104 mEq/L (ref 96–112)
Creatinine, Ser: 0.93 mg/dL (ref 0.50–1.10)

## 2011-01-01 LAB — CULTURE, BLOOD (ROUTINE X 2)

## 2011-01-02 LAB — CBC
MCH: 32.3 pg (ref 26.0–34.0)
MCHC: 33.7 g/dL (ref 30.0–36.0)
Platelets: 96 10*3/uL — ABNORMAL LOW (ref 150–400)
RDW: 13 % (ref 11.5–15.5)

## 2011-01-19 NOTE — Discharge Summary (Signed)
[name], [name] NO.:  [id number]  MEDICAL RECORD NO.:  [id number]  LOCATION:  1408                         FACILITY:  [facility]  PHYSICIAN:  Clydia Llano, MD       DATE OF BIRTH:  [birth year]  DATE OF ADMISSION:  12/28/2010 DATE OF DISCHARGE:                        DISCHARGE SUMMARY - REFERRING   PRIMARY CARE PHYSICIAN:  Hal T. Stoneking, M.D.  REASON FOR ADMISSION:  Fever.  DISCHARGE DIAGNOSES: 1. Escherichia coli septicemia. 2. Escherichia coli urinary tract infection. 3. Acute renal failure, resolved. 4. Thrombocytopenia. 5. Acute delirium. 6. Hyperlipidemia. 7. Gastroesophageal reflux disease. 8. Chronic pain secondary to advanced osteoarthritis. 9. History of breast cancer, status post lumpectomy in 2004. 10.Macular degeneration. 11.Glaucoma.  DISCHARGE MEDICATIONS: 1. Ciprofloxacin 500 mg p.o. b.i.d. for 10 more days. 2. Alphagan 0.1% 1 drop both eyes every morning. 3. Amlodipine 5 mg p.o. daily.4. Aspirin 81 mg p.o. daily. 5. Calcium antacid 400 mg p.o. b.i.d. with meals. 6. Calcium 100 mg 2 capsules daily as needed for constipation. 7. Enalapril 5 mg p.o. daily. 8. Flonase nasal spray, 2 sprays nasally daily. 9. Fosamax 70 mg p.o. weekly, hold while in the hospital. 10.Hydrochlorothiazide 25 mg p.o. daily. 11.Ketoconazole cream 2%, apply topically twice daily. 12.Levothyroxine 88 mcg p.o. daily. 13.Lidocaine patch 5%, apply half patch to each shoulder and one patch     to the right knee every 12 hours. 14.Methocarbamol 500 mg every 6 hours as needed for muscle spasm. 15.Percocet 5/325 mg 2 tablets 3 times daily as needed for pain. 16.Potassium chloride oral liquids 20 mEq/50 mL, take 50 mL by mouth     daily. 17.Omeprazole 20 mg p.o. b.i.d. 18.Vistaril 25 mg daily at bedtime. 19.Vitamin D3 400 units p.o. daily.  RADIOLOGY:  Chest x-ray, June 13, showed hypoventilation, mild bibasilar atelectasis.  BRIEF HISTORY AND EXAMINATION:  Ms.  Castro is an 77 year old Caucasian female with past medical history of hyperlipidemia, gastroesophageal reflux disease, hypertension.  The patient lives in the nursing home. At this time, I found her to have high fever.  The patient was brought to the emergency department, does not know how long she had the fever, not able to tell exactly why she is in the emergency department.  The patient has baseline dementia and probably exacerbated with acute delirium with this fever.  Upon initial evaluation in the emergency department, chest x-ray showed hypoventilation with atelectasis.  UA was consistent with UTI.  The patient admitted for further evaluation.  BRIEF HOSPITAL COURSE: 1. Escherichia coli septicemia.  The patient admitted to step-down     unit because of low blood pressure.  Her lactate was high.  The     patient was started initially on vancomycin and Zosyn.  The very     next day, Zosyn was discontinued.  The urine culture came back     positive for UTI E. coli, but the Zosyn was continued for 4 days in     the hospital until the blood cultures come back basically of the     same organism.  E. coli which is susceptible to fluoroquinolones.     The Zosyn was discontinued and the patient was started  on high-dose     ciprofloxacin.  The patient tolerated that well.  Blood pressure     was controlled.  Blood pressure was okay since day 2.  At that     time, the patient was transferred to telemetry bed.  Blood pressure     went up to the point that her blood pressure medications were     restarted.  The patient back to her baseline on day of discharge. 2. E. coli UTI, probably causing E. coli septicemia.  Treatment and     management were same as in #1. 3. Acute renal failure.  The patient came in with acute renal failure,     probably secondary to dehydration and sepsis syndrome.  The     creatinine went up to 1.4.  Her lisinopril and diuretics were     stopped and the patient  aggressively hydrated with IV fluids.  Her     creatinine on the day of discharge is 0.9. 4. Thrombocytopenia.  The patient's platelets at admission were     clumped but they were seen to be decreased.  Repeat CBC which     showed platelets to be 78,000.  Previously, platelets were normal     in count.  This was thought to be primarily secondary to sepsis     syndrome.  Lovenox and Beta-Lactam antibiotics might be     contributing as well.  The Lovenox was discontinued and the Zosyn     was switched to ciprofloxacin when blood cultures came back.  I     have been seeing steady uphill trend of her platelets, started at     78,000 and on the day of discharge is 96,000.  Probably by week or     so, platelets will go back to normal.  That should be followed as     outpatient. 5. Acute delirium.  The patient has history of dementia but she seems     pretty okay while she is in the hospital.  The patient's delirium     is secondary to acute illness and sepsis.  This is being resolved     before discharge.  The patient is back to her baseline in terms of     mentation. 6. Hypothyroidism.  Continue the Synthroid.  No changes were done to     the Synthroid dosage. 7. Chronic low back pain and shoulder pain.  The patient noted to have     advanced osteoarthritis with multiple joint replacements in the     past.  Patient has been on lidocaine patches as well as on and off     steroids for the joints pain.  The patient was followed up during     this hospital stay and her lidocaine patch was continued. 8. Hyperglycemia.  Upon presentation to the hospital, the patient's     blood sugar was in the high side above 200.  Hemoglobin A1c was 5.9     which indicates nondiabetic state.  I suspect that the     hyperglycemia is secondary to the steroids.  The patient was taken     just the day prior to admission for her joints.  DISCHARGE INSTRUCTIONS: 1. Activity as tolerated. 2. Disposition, skilled  nursing facility. 3. Diet, heart-healthy diet.     Clydia Llano, MD     ME/MEDQ  D:  01/02/2011  T:  01/02/2011  Job:  [id number]  cc:   Hal T. Stoneking, M.D. Fax:  [phone]  Electronically Signed by Clydia Llano  on 01/19/2011 02:30:11 PM

## 2011-01-27 ENCOUNTER — Emergency Department (HOSPITAL_COMMUNITY): Payer: Medicare Other

## 2011-01-27 ENCOUNTER — Inpatient Hospital Stay (HOSPITAL_COMMUNITY)
Admission: EM | Admit: 2011-01-27 | Discharge: 2011-02-02 | DRG: 394 | Disposition: A | Discharge status: Skilled Nursing Facility | Payer: Medicare Other | Attending: Family Medicine | Admitting: Family Medicine

## 2011-01-27 DIAGNOSIS — Z66 Do not resuscitate: Secondary | ICD-10-CM | POA: Diagnosis present

## 2011-01-27 DIAGNOSIS — M810 Age-related osteoporosis without current pathological fracture: Secondary | ICD-10-CM | POA: Diagnosis present

## 2011-01-27 DIAGNOSIS — M1990 Unspecified osteoarthritis, unspecified site: Secondary | ICD-10-CM | POA: Diagnosis present

## 2011-01-27 DIAGNOSIS — H409 Unspecified glaucoma: Secondary | ICD-10-CM | POA: Diagnosis present

## 2011-01-27 DIAGNOSIS — Z7982 Long term (current) use of aspirin: Secondary | ICD-10-CM

## 2011-01-27 DIAGNOSIS — K219 Gastro-esophageal reflux disease without esophagitis: Secondary | ICD-10-CM | POA: Diagnosis present

## 2011-01-27 DIAGNOSIS — E876 Hypokalemia: Secondary | ICD-10-CM | POA: Diagnosis not present

## 2011-01-27 DIAGNOSIS — M5430 Sciatica, unspecified side: Secondary | ICD-10-CM | POA: Diagnosis present

## 2011-01-27 DIAGNOSIS — E039 Hypothyroidism, unspecified: Secondary | ICD-10-CM | POA: Diagnosis present

## 2011-01-27 DIAGNOSIS — Z79899 Other long term (current) drug therapy: Secondary | ICD-10-CM

## 2011-01-27 DIAGNOSIS — E860 Dehydration: Secondary | ICD-10-CM | POA: Diagnosis present

## 2011-01-27 DIAGNOSIS — I10 Essential (primary) hypertension: Secondary | ICD-10-CM | POA: Diagnosis present

## 2011-01-27 DIAGNOSIS — D72829 Elevated white blood cell count, unspecified: Secondary | ICD-10-CM | POA: Diagnosis present

## 2011-01-27 DIAGNOSIS — D62 Acute posthemorrhagic anemia: Secondary | ICD-10-CM | POA: Diagnosis not present

## 2011-01-27 DIAGNOSIS — D696 Thrombocytopenia, unspecified: Secondary | ICD-10-CM | POA: Diagnosis not present

## 2011-01-27 DIAGNOSIS — E785 Hyperlipidemia, unspecified: Secondary | ICD-10-CM | POA: Diagnosis present

## 2011-01-27 DIAGNOSIS — F3289 Other specified depressive episodes: Secondary | ICD-10-CM | POA: Diagnosis present

## 2011-01-27 DIAGNOSIS — Z853 Personal history of malignant neoplasm of breast: Secondary | ICD-10-CM

## 2011-01-27 DIAGNOSIS — F329 Major depressive disorder, single episode, unspecified: Secondary | ICD-10-CM | POA: Diagnosis present

## 2011-01-27 DIAGNOSIS — K559 Vascular disorder of intestine, unspecified: Principal | ICD-10-CM | POA: Diagnosis present

## 2011-01-27 DIAGNOSIS — H3530 Unspecified macular degeneration: Secondary | ICD-10-CM | POA: Diagnosis present

## 2011-01-27 LAB — URINALYSIS, ROUTINE W REFLEX MICROSCOPIC
Bilirubin Urine: NEGATIVE
Specific Gravity, Urine: 1.022 (ref 1.005–1.030)
Urobilinogen, UA: 0.2 mg/dL (ref 0.0–1.0)

## 2011-01-27 LAB — COMPREHENSIVE METABOLIC PANEL
AST: 15 U/L (ref 0–37)
BUN: 27 mg/dL — ABNORMAL HIGH (ref 6–23)
CO2: 26 mEq/L (ref 19–32)
Chloride: 103 mEq/L (ref 96–112)
Creatinine, Ser: 1.1 mg/dL (ref 0.50–1.10)
GFR calc Af Amer: 57 mL/min — ABNORMAL LOW (ref >60)
GFR calc non Af Amer: 47 mL/min — ABNORMAL LOW (ref >60)
Glucose, Bld: 142 mg/dL — ABNORMAL HIGH (ref 70–99)
Total Bilirubin: 0.5 mg/dL (ref 0.3–1.2)

## 2011-01-27 LAB — CBC
HCT: 41.2 % (ref 36.0–46.0)
Hemoglobin: 14.2 g/dL (ref 12.0–15.0)
MCV: 98.1 fL (ref 78.0–100.0)
RBC: 4.2 MIL/uL (ref 3.87–5.11)
WBC: 17.9 10*3/uL — ABNORMAL HIGH (ref 4.0–10.5)

## 2011-01-27 LAB — DIFFERENTIAL
Lymphocytes Relative: 5 % — ABNORMAL LOW (ref 12–46)
Lymphs Abs: 1 10*3/uL (ref 0.7–4.0)
Monocytes Relative: 6 % (ref 3–12)
Neutrophils Relative %: 89 % — ABNORMAL HIGH (ref 43–77)

## 2011-01-27 LAB — TYPE AND SCREEN: Antibody Screen: NEGATIVE

## 2011-01-27 LAB — URINE MICROSCOPIC-ADD ON

## 2011-01-27 MED ORDER — IOHEXOL 300 MG/ML  SOLN
100.0000 mL | Freq: Once | INTRAMUSCULAR | Status: AC | PRN
  Administered 2011-01-27: 100 mL via INTRAVENOUS

## 2011-01-28 LAB — HEMOGLOBIN AND HEMATOCRIT, BLOOD
Hemoglobin: 13.5 g/dL (ref 12.0–15.0)
Hemoglobin: 13.1 g/dL (ref 12.0–15.0)

## 2011-01-29 LAB — CBC
HCT: 33.7 % — ABNORMAL LOW (ref 36.0–46.0)
Hemoglobin: 11.7 g/dL — ABNORMAL LOW (ref 12.0–15.0)
MCH: 34.1 pg — ABNORMAL HIGH (ref 26.0–34.0)
MCHC: 34.7 g/dL (ref 30.0–36.0)

## 2011-01-29 LAB — BASIC METABOLIC PANEL
BUN: 17 mg/dL (ref 6–23)
Calcium: 8.3 mg/dL — ABNORMAL LOW (ref 8.4–10.5)
Creatinine, Ser: 0.91 mg/dL (ref 0.50–1.10)
GFR calc non Af Amer: 59 mL/min — ABNORMAL LOW (ref >60)
Glucose, Bld: 116 mg/dL — ABNORMAL HIGH (ref 70–99)

## 2011-01-30 LAB — BASIC METABOLIC PANEL
BUN: 10 mg/dL (ref 6–23)
Creatinine, Ser: 0.88 mg/dL (ref 0.50–1.10)
GFR calc Af Amer: >60 mL/min (ref >60)
GFR calc non Af Amer: >60 mL/min (ref >60)

## 2011-01-30 LAB — CBC
HCT: 32.8 % — ABNORMAL LOW (ref 36.0–46.0)
MCHC: 33.5 g/dL (ref 30.0–36.0)
MCV: 98.8 fL (ref 78.0–100.0)
RDW: 13.4 % (ref 11.5–15.5)

## 2011-01-31 LAB — BASIC METABOLIC PANEL
BUN: 5 mg/dL — ABNORMAL LOW (ref 6–23)
CO2: 27 mEq/L (ref 19–32)
Calcium: 8.5 mg/dL (ref 8.4–10.5)
Creatinine, Ser: 0.89 mg/dL (ref 0.50–1.10)

## 2011-01-31 LAB — CBC
HCT: 34.6 % — ABNORMAL LOW (ref 36.0–46.0)
MCH: 32.5 pg (ref 26.0–34.0)
MCV: 96.9 fL (ref 78.0–100.0)
Platelets: 169 10*3/uL (ref 150–400)
RBC: 3.57 MIL/uL — ABNORMAL LOW (ref 3.87–5.11)
RDW: 13 % (ref 11.5–15.5)

## 2011-01-31 LAB — MAGNESIUM: Magnesium: 1.8 mg/dL (ref 1.5–2.5)

## 2011-02-01 ENCOUNTER — Inpatient Hospital Stay (HOSPITAL_COMMUNITY): Payer: Medicare Other

## 2011-02-01 LAB — BASIC METABOLIC PANEL
BUN: 3 mg/dL — ABNORMAL LOW (ref 6–23)
CO2: 28 mEq/L (ref 19–32)
Calcium: 8.7 mg/dL (ref 8.4–10.5)
Creatinine, Ser: 0.74 mg/dL (ref 0.50–1.10)
Glucose, Bld: 106 mg/dL — ABNORMAL HIGH (ref 70–99)

## 2011-02-01 LAB — IRON AND TIBC
Iron: 124 ug/dL (ref 42–135)
UIBC: <55 ug/dL

## 2011-02-01 LAB — CBC
HCT: 34.9 % — ABNORMAL LOW (ref 36.0–46.0)
Hemoglobin: 12 g/dL (ref 12.0–15.0)
MCH: 33.1 pg (ref 26.0–34.0)
MCV: 96.1 fL (ref 78.0–100.0)
Platelets: 190 10*3/uL (ref 150–400)
RBC: 3.63 MIL/uL — ABNORMAL LOW (ref 3.87–5.11)

## 2011-02-01 LAB — HEMOGLOBIN AND HEMATOCRIT, BLOOD
HCT: 33.1 % — ABNORMAL LOW (ref 36.0–46.0)
Hemoglobin: 11.3 g/dL — ABNORMAL LOW (ref 12.0–15.0)

## 2011-02-02 LAB — BASIC METABOLIC PANEL
CO2: 26 mEq/L (ref 19–32)
Calcium: 8.6 mg/dL (ref 8.4–10.5)
GFR calc non Af Amer: >60 mL/min (ref >60)
Potassium: 4.2 mEq/L (ref 3.5–5.1)
Sodium: 140 mEq/L (ref 135–145)

## 2011-02-02 LAB — CBC
MCH: 32.8 pg (ref 26.0–34.0)
Platelets: 204 10*3/uL (ref 150–400)
RBC: 3.32 MIL/uL — ABNORMAL LOW (ref 3.87–5.11)
WBC: 6.7 10*3/uL (ref 4.0–10.5)

## 2011-02-02 LAB — HEMOGLOBIN AND HEMATOCRIT, BLOOD
HCT: 34.4 % — ABNORMAL LOW (ref 36.0–46.0)
Hemoglobin: 11.4 g/dL — ABNORMAL LOW (ref 12.0–15.0)

## 2011-02-02 NOTE — Discharge Summary (Signed)
[name], [name] NO.:  [id number]  MEDICAL RECORD NO.:  [id number]  LOCATION:  4710                         FACILITY:  [facility]  PHYSICIAN:  Standley Dakins, MD   DATE OF BIRTH:  1924/05/12  DATE OF ADMISSION:  01/27/2011 DATE OF DISCHARGE:                        DISCHARGE SUMMARY - REFERRING   PRIMARY CARE PHYSICIAN:  Hal T. Pete Glatter, MD  GASTROENTEROLOGIST:  Petra Kuba, MD  DISCHARGE DIAGNOSES: 1. Ischemic colitis. 2. Hypertension. 3. Gastroesophageal reflux disease. 4. Hyperlipidemia. 5. Breast cancer,status post lumpectomy. 6. Hypothyroidism. 7. DNR 8. Osteoporosis. 9. Macular degeneration. 10  Glaucoma. 11  Osteoarthritis.  DISCHARGE MEDICATIONS: 1. Amlodipine 5 mg p.o. daily. 2. Alphagan eyedrops daily. 3. Vitamin D3 400 units once daily. 4. Ciprofloxacin 500 mg p.o. b.i.d. 5. Enalapril 5 mg p.o. daily. 6. Hydrochlorothiazide 25 mg p.o. daily. 7. Levothyroxine 88 mcg p.o. daily. 8. Metronidazole 500 mg p.o. q.8 h. 9. Protonix 40 mg p.o. once daily. 10.Potassium chloride 40 mEq p.o. b.i.d. 11.ReQuip 0.5 mg p.o. daily. 12.Lorazepam 0.5 mg p.o. at bedtime. P.r.n. 13.Robaxin 500 mg p.o. q.6 h p.r.n.  HOSPITAL COURSE:  Briefly, this patient is an 77 year old female who presented to the hospital complaining of bloody diarrhea and found to have ischemic colitis started on ciprofloxacin and metronidazole and seen by the gastroenterologist who performed sigmoidoscopy study that could not be completely seen secondary to edema in the sigmoid colon. Please see the operative report from Dr. Bosie Clos from January 31, 2011. The findings were severely edematous sigmoid colon from presumed colitis.  The patient did very well with the treatment with initially being kept n.p.o. and then eventually started on food and tolerating it well.    We replaced her electrolytes as needed, hydrated her with IV fluids and she did very well and symptoms  improved considerably to the point of resolution completely on day of discharge.  The patient was evaluated by Physical Therapy, and they recommended acute rehab.  The patient will be discharged to acute rehab and then when stronger and more stable, we will go back to this assisted living.  Her hemoglobin was monitored closely and remained stable at around 11.  The bloody diarrhea and bowel movements resolved.  DISCHARGE CONDITION:  Stable.  DISPOSITION:  The patient will be discharged to acute skilled nursing rehab when bed available.  ACTIVITY:  Per physical therapy and rehab.  ASSESSMENT AND RECOMMENDATIONS:  Fall precautions recommended.  DIET:  Resume previous cardiac diet.  FOLLOWUP:  Follow up with Merlene Laughter, MD, her primary care physician in 1 week.  Follow up with her gastroenterologist, Dr. Ewing Schlein in 1 month.  SPECIAL INSTRUCTIONS:  Please return if symptoms recur, worsen or new changes develop.  Please follow up as arranged above.  Please participate in physical activity and rehabilitation and physical therapy as recommended.  I spent 40 minutes preparing discharge including reviewing consultation notes, medical records, reconciling medications and providing counseling to the patient and counseling with the care coordination team.     Standley Dakins, MD     CJ/MEDQ  D:  02/02/2011  T:  02/02/2011  Job:  [id number]  cc:   Hal T. Stoneking, M.D. Lavada Mesi  Magod, M.D.  Electronically Signed by Standley Dakins  on 02/02/2011 06:46:52 PM

## 2011-02-03 LAB — CLOSTRIDIUM DIFFICILE BY PCR: Toxigenic C. Difficile by PCR: NEGATIVE

## 2011-02-13 NOTE — H&P (Signed)
NAMEGARNETT, [name] NO.:  [id number]  MEDICAL RECORD NO.:  [id number]  LOCATION:  [facility]                         FACILITY:  [facility]  PHYSICIAN:  Houston Siren, MD           DATE OF BIRTH:  1924-02-19  DATE OF ADMISSION:  01/27/2011 DATE OF DISCHARGE:                             HISTORY & PHYSICAL   PRIMARY CARE PHYSICIAN:  Hal T. Stoneking, MD  ADVANCED DIRECTIVES:  Do not resuscitate.  This was reconfirmed tonight.  REASON FOR ADMISSION:  Bloody diarrhea.  HISTORY OF PRESENT ILLNESS:  A 77 year old female with history of hypercholesterolemia, GERD, hypertension, breast cancer status post lumpectomy, hypothyroidism, a resident of [facility] with DNR code status presents to the emergency room at [facility] with bloody diarrhea for 2 days.  She also has some abdominal pain and cramps but not severe. She denied any chest pain, shortness of breath, nausea, vomiting, fever, or chills.  There has been no definite ill contact and no distant travel.  She was not on any antibiotics recently.  She had a colonoscopy in 2004 filed in [place], and at that time, it was done by Dr. Evette Cristal which did show 2 polyps and diverticulosis.  Workup in the emergency room included a hemoglobin of 14.3.  She had slight elevation with BUN of 27, but no black stool.  Her creatinine is normal at 1.10.  She maintained hemodynamic stability in the emergency room. It should be noted that she does have a low-grade temperature of 99 and leukocytosis with white count of 17,000.  An abdominal pelvic CT was performed which did show inflammatory bowel consistent with colitis. Hospitalist was asked to admit the patient for further evaluation of her lower GI bleed.  PAST MEDICAL HISTORY:  Breast cancer, depression, GERD, glaucoma, hyperlipidemia, hypertension, hypothyroidism, macular degeneration, osteoarthritis, osteoporosis.  SOCIAL HISTORY:  No drug, alcohol, or tobacco use.  She is living  in nursing home.  FAMILY HISTORY:  Significant for family history of colon cancer.  REVIEW OF SYSTEMS:  Otherwise unremarkable except that she does have restless leg syndrome.  MEDICATIONS:  Alendronate, amlodipine, aspirin, brimonidine, Colace, enalapril, HCTZ, hydroxyzine, Synthroid supplement, meclizine, Percocet, ReQuip, Robaxin, and senna.  ALLERGIES:  MORPHINE.  PHYSICAL EXAMINATION:  VITAL SIGNS:  Blood pressure 170/80, pulse of 88, respiratory rate 18, temperature 99.0. GENERAL:  She is alert and oriented and is in no apparent distress.  She conversed normally with fluent speech. HEENT:  Sclerae are nonicteric.  Throat is clear. NECK:  Supple.  No lymphadenopathy or thyromegaly. CARDIAC:  S1, S2 regular.  There is a soft 2/6 systolic ejection murmur at the left sternal border. LUNGS:  Clear. ABDOMEN:  Nondistended, slightly tender diffusely but no rebound.  No palpable mass.  Bowel sounds present. EXTREMITIES:  With no edema.  Good distal pulses bilaterally. SKIN:  Warm and dry. NEUROLOGICAL:  Nonfocal.  She is able to move all 4 extremities strongly. PSYCHIATRIC:  Suboptimal but no obvious depression.  OBJECTIVE FINDINGS:  Serum sodium 140, potassium 4.2, glucose 442, creatinine 1.1.  White count of 17.9 thousand, hemoglobin of 14.2, platelet count of 150,000.  CT scan shows evidence  of colitis involving the mid transverse colon, descending colon, and sigmoid colon with moderate free fluid in the pelvis and no perforation or abscess.  Her urinalysis only shows small leukocyte esterase.  ASSESSMENT/PLAN:  This is a 77 year old female presenting with bloody stool and abdominal pain.  The differential is great which would include infectious diarrhea, ischemic colitis, or even inflammatory bowel disease.  She was not on any antibiotic recently, and I do not think she has C. diff, but that should be checked as well.  Since she has fever and elevated white count, and I  did not think that she has C. diff colitis, we will treat her with antibiotics to include Cipro and Flagyl.  I have asked emergency room to consult GI as she will likely need colonoscopy. With that in mind, we will give her only clear liquid and start her on IV fluid and pain medication.  As soon as her medications are reconciled, we will continue them.  She is a confirmed do not resuscitate, and we will honor her wish.  We will admit her to [facility].  She is stable.  I will go ahead and switch over to low dose benzodiazepine for her RLS, since her current medication isn't working for her.     Houston Siren, MD     PL/MEDQ  D:  01/27/2011  T:  01/27/2011  Job:  [id number]  Electronically Signed by Houston Siren  on 02/13/2011 03:03:18 AM

## 2011-02-21 NOTE — Consult Note (Signed)
  NAMEJADI, DEYARMIN NO.:  [id number]  MEDICAL RECORD NO.:  [id number]  LOCATION:  4710                         FACILITY:  [facility]  PHYSICIAN:  Petra Kuba, M.D.    DATE OF BIRTH:  Dec 20, 1923  DATE OF CONSULTATION:  01/28/2011 DATE OF DISCHARGE:                                CONSULTATION   HISTORY:  The patient is seen at request of the hospital doctor for GI bleeding and abdominal pain and a CAT scan showing some transverse and descending colitis.  The symptoms came on all of a sudden yesterday and she is actually better today without any bleeding today.  She did have a colonoscopy in 2004 with some small polyps by Dr. Evette Cristal and an endoscopy and dilatation in 2007 without problems other than some rare constipation, really has not had any recent GI issues or problems, and has not been on any recent antibiotics, been around any obvious sick people, or started on any new medicines.  PAST MEDICAL HISTORY:  Pertinent for: 1. Breast cancer. 2. Depression. 3. GERD. 4. Glaucoma. 5. Increased cholesterol. 6. Hypertension. 7. Hypothyroidism. 8. Macular degeneration. 9. Arthritis. 10.Osteoporosis.  SOCIAL HISTORY:  Does not drink or smoke.  Lives at a nursing care home.  FAMILY HISTORY:  In the chart is pertinent for a family member with colon cancer, but she says that one of her sisters died in her early age from colitis and no other GI problems mentioned.  REVIEW OF SYSTEMS:  Negative except above.  MEDICINES AT HOME:  Alendronate, amlodipine, aspirin, famotidine, Colace, enalapril, HCTZ, hydroxyzine, Synthroid, meclizine, Percocet, ReQuip, Robaxin, and senna.  ALLERGIES:  MORPHINE.  REVIEW OF SYSTEMS:  Negative except above.  PHYSICAL EXAMINATION:  VITAL SIGNS:  Stable, afebrile. ABDOMEN:  Pertinent for her abdomen being soft with occasional bowel sounds, slightly tender throughout without any rebound.  Slight guarding in the lower quadrants.   Adequate peripheral pulses.  CT as above.  LABORATORY DATA:  Pertinent for hemoglobin today of 13.5, white count yesterday 17.9 with normal hemoglobin, MCV, and platelets.  Chemistries yesterday, BUN 27, creatinine 1.1.  Liver tests normal.  Albumin 3.8. Urinalysis pertinent for trace blood, small leukocytes, 3-6 white blood cells, 0-2 red blood cells.  TSH was normal.  ASSESSMENT: 1. Multiple medical problems. 2. Probable ischemic colitis.  PLAN:  Consider Cipro and Flagyl, but can wait on tomorrow's exam and white count.  We will allow clear liquids for now.  Consider a flexible sigmoidoscopy or colonoscopy to prove the diagnosis, but would wait for her pain to decrease.  We will follow with you.  Thank you very much.          ______________________________ Petra Kuba, M.D.     MEM/MEDQ  D:  01/28/2011  T:  01/29/2011  Job:  [id number]  cc:   Hal T. Stoneking, M.D. Graylin Shiver, M.D.  Electronically Signed by Vida Rigger M.D. on 02/21/2011 04:34:18 PM

## 2011-02-24 ENCOUNTER — Emergency Department (HOSPITAL_COMMUNITY)
Admission: EM | Admit: 2011-02-24 | Discharge: 2011-02-24 | Disposition: A | Discharge status: Home / Self Care | Payer: Medicare Other | Attending: Emergency Medicine | Admitting: Emergency Medicine

## 2011-02-24 DIAGNOSIS — M5430 Sciatica, unspecified side: Secondary | ICD-10-CM | POA: Insufficient documentation

## 2011-02-24 DIAGNOSIS — G8929 Other chronic pain: Secondary | ICD-10-CM | POA: Insufficient documentation

## 2011-02-24 DIAGNOSIS — I10 Essential (primary) hypertension: Secondary | ICD-10-CM | POA: Insufficient documentation

## 2011-02-24 DIAGNOSIS — Z853 Personal history of malignant neoplasm of breast: Secondary | ICD-10-CM | POA: Insufficient documentation

## 2011-02-24 DIAGNOSIS — E039 Hypothyroidism, unspecified: Secondary | ICD-10-CM | POA: Insufficient documentation

## 2011-03-07 NOTE — Op Note (Signed)
  NAMETYLYN, STANKOVICH NO.:  [id number]  MEDICAL RECORD NO.:  [id number]  LOCATION:  4710                         FACILITY:  [facility]  PHYSICIAN:  Shirley Friar, MDDATE OF BIRTH:  1923/09/21  DATE OF PROCEDURE:  01/31/2011 DATE OF DISCHARGE:                              OPERATIVE REPORT   INDICATION:  GI bleed, abnormal CT scan showing left-sided colitis.  MEDICATIONS: 1. Fentanyl 75 mcg IV. 2. Versed 7.5 mg IV.  FINDINGS:  Standard pediatric colonoscope was inserted into a fair prepped colon and advanced to 30-cm when the mucosa was noted to be very edematous and did not allow passage of the colonoscope proximal to that due to this edema as well as the patient's discomfort.  Standard pediatric colonoscope was withdrawn and a standard endoscope was inserted into the rectum and passed up to 30-cm.  The mucosa was again noted to be very edematous and the endoscope could not be advanced proximal to the sigmoid colon area.  The mucosa was very edematous without any ulceration in the examined colon and no erythema was seen. On withdrawal of the colonoscope, retroflexion was done which revealed small internal hemorrhoids.  The endoscope was then withdrawn.  ASSESSMENT:  Severely edematous sigmoid colon from presumed colitis, preventing passage of the scope proximal to 30-cm.  PLAN: 1. Continue IV antibiotics and clear liquids.  Check abdominal x-ray     to re-evaluate colon. 2. May need a colonoscopy at some point for evaluation of the left     side of the colon, but will hold off on reattempt at this time.     Shirley Friar, MD     VCS/MEDQ  D:  01/31/2011  T:  02/01/2011  Job:  [id number]  cc:   Graylin Shiver, M.D. Hal T. Stoneking, M.D.  Electronically Signed by Charlott Rakes MD on 03/07/2011 04:33:00 PM

## 2011-03-27 ENCOUNTER — Inpatient Hospital Stay (HOSPITAL_COMMUNITY)
Admission: EM | Admit: 2011-03-27 | Discharge: 2011-04-03 | DRG: 482 | Disposition: A | Discharge status: Skilled Nursing Facility | Payer: Medicare Other | Attending: Orthopedic Surgery | Admitting: Orthopedic Surgery

## 2011-03-27 DIAGNOSIS — I10 Essential (primary) hypertension: Secondary | ICD-10-CM | POA: Diagnosis present

## 2011-03-27 DIAGNOSIS — E876 Hypokalemia: Secondary | ICD-10-CM | POA: Diagnosis not present

## 2011-03-27 DIAGNOSIS — F411 Generalized anxiety disorder: Secondary | ICD-10-CM | POA: Diagnosis present

## 2011-03-27 DIAGNOSIS — K219 Gastro-esophageal reflux disease without esophagitis: Secondary | ICD-10-CM | POA: Diagnosis present

## 2011-03-27 DIAGNOSIS — H409 Unspecified glaucoma: Secondary | ICD-10-CM | POA: Diagnosis present

## 2011-03-27 DIAGNOSIS — Z8719 Personal history of other diseases of the digestive system: Secondary | ICD-10-CM

## 2011-03-27 DIAGNOSIS — D6489 Other specified anemias: Secondary | ICD-10-CM | POA: Diagnosis not present

## 2011-03-27 DIAGNOSIS — M810 Age-related osteoporosis without current pathological fracture: Secondary | ICD-10-CM | POA: Diagnosis present

## 2011-03-27 DIAGNOSIS — S72143A Displaced intertrochanteric fracture of unspecified femur, initial encounter for closed fracture: Principal | ICD-10-CM | POA: Diagnosis present

## 2011-03-27 DIAGNOSIS — E039 Hypothyroidism, unspecified: Secondary | ICD-10-CM | POA: Diagnosis present

## 2011-03-27 DIAGNOSIS — M1990 Unspecified osteoarthritis, unspecified site: Secondary | ICD-10-CM | POA: Diagnosis present

## 2011-03-27 DIAGNOSIS — R296 Repeated falls: Secondary | ICD-10-CM | POA: Diagnosis present

## 2011-03-27 DIAGNOSIS — Z66 Do not resuscitate: Secondary | ICD-10-CM | POA: Diagnosis present

## 2011-03-27 DIAGNOSIS — Z9221 Personal history of antineoplastic chemotherapy: Secondary | ICD-10-CM

## 2011-03-27 DIAGNOSIS — Z87898 Personal history of other specified conditions: Secondary | ICD-10-CM

## 2011-03-27 DIAGNOSIS — Y9210 Unspecified residential institution as the place of occurrence of the external cause: Secondary | ICD-10-CM | POA: Diagnosis present

## 2011-03-27 DIAGNOSIS — Z9010 Acquired absence of unspecified breast and nipple: Secondary | ICD-10-CM

## 2011-03-27 DIAGNOSIS — K222 Esophageal obstruction: Secondary | ICD-10-CM | POA: Diagnosis present

## 2011-03-28 ENCOUNTER — Emergency Department (HOSPITAL_COMMUNITY): Payer: Medicare Other

## 2011-03-28 LAB — URINALYSIS, ROUTINE W REFLEX MICROSCOPIC
Bilirubin Urine: NEGATIVE
Glucose, UA: NEGATIVE mg/dL
Hgb urine dipstick: NEGATIVE
Ketones, ur: NEGATIVE mg/dL
Leukocytes, UA: NEGATIVE
pH: 5.5 (ref 5.0–8.0)

## 2011-03-28 LAB — DIFFERENTIAL
Basophils Absolute: 0 10*3/uL (ref 0.0–0.1)
Basophils Relative: 0 % (ref 0–1)
Eosinophils Absolute: 1 10*3/uL — ABNORMAL HIGH (ref 0.0–0.7)
Eosinophils Relative: 12 % — ABNORMAL HIGH (ref 0–5)
Lymphocytes Relative: 18 % (ref 12–46)
Monocytes Absolute: 0.6 10*3/uL (ref 0.1–1.0)

## 2011-03-28 LAB — SAMPLE TO BLOOD BANK

## 2011-03-28 LAB — BASIC METABOLIC PANEL
CO2: 28 mEq/L (ref 19–32)
Calcium: 9.9 mg/dL (ref 8.4–10.5)
Creatinine, Ser: 1.03 mg/dL (ref 0.50–1.10)
GFR calc non Af Amer: 51 mL/min — ABNORMAL LOW (ref >60)
Sodium: 139 mEq/L (ref 135–145)

## 2011-03-28 LAB — CBC
MCHC: 33.5 g/dL (ref 30.0–36.0)
Platelets: 217 10*3/uL (ref 150–400)
RDW: 12.4 % (ref 11.5–15.5)
WBC: 8.1 10*3/uL (ref 4.0–10.5)

## 2011-03-29 ENCOUNTER — Inpatient Hospital Stay (HOSPITAL_COMMUNITY): Payer: Medicare Other

## 2011-03-29 LAB — BASIC METABOLIC PANEL WITH GFR
BUN: 12 mg/dL (ref 6–23)
CO2: 27 meq/L (ref 19–32)
Calcium: 8.8 mg/dL (ref 8.4–10.5)
Chloride: 106 meq/L (ref 96–112)
Creatinine, Ser: 0.74 mg/dL (ref 0.50–1.10)
GFR calc Af Amer: >60 mL/min
GFR calc non Af Amer: >60 mL/min
Glucose, Bld: 91 mg/dL (ref 70–99)
Potassium: 3.5 meq/L (ref 3.5–5.1)
Sodium: 141 meq/L (ref 135–145)

## 2011-03-29 LAB — CBC
Hemoglobin: 11.6 g/dL — ABNORMAL LOW (ref 12.0–15.0)
MCV: 98.9 fL (ref 78.0–100.0)
Platelets: 190 10*3/uL (ref 150–400)
RBC: 3.53 MIL/uL — ABNORMAL LOW (ref 3.87–5.11)
WBC: 10.3 10*3/uL (ref 4.0–10.5)

## 2011-03-29 LAB — SURGICAL PCR SCREEN
MRSA, PCR: NEGATIVE
Staphylococcus aureus: NEGATIVE

## 2011-03-29 LAB — URINE CULTURE
Colony Count: NO GROWTH
Culture: NO GROWTH

## 2011-03-29 LAB — MRSA PCR SCREENING: MRSA by PCR: NEGATIVE

## 2011-03-30 LAB — BASIC METABOLIC PANEL
BUN: 14 mg/dL (ref 6–23)
CO2: 25 mEq/L (ref 19–32)
Calcium: 8.1 mg/dL — ABNORMAL LOW (ref 8.4–10.5)
Creatinine, Ser: 0.74 mg/dL (ref 0.50–1.10)
Glucose, Bld: 75 mg/dL (ref 70–99)

## 2011-03-30 LAB — CBC
HCT: 30 % — ABNORMAL LOW (ref 36.0–46.0)
MCHC: 34 g/dL (ref 30.0–36.0)
MCV: 98.4 fL (ref 78.0–100.0)
RDW: 12.3 % (ref 11.5–15.5)

## 2011-03-31 LAB — CBC
MCH: 33.4 pg (ref 26.0–34.0)
MCV: 98 fL (ref 78.0–100.0)
Platelets: 175 10*3/uL (ref 150–400)
RDW: 12.4 % (ref 11.5–15.5)

## 2011-03-31 LAB — PROTIME-INR: Prothrombin Time: 16.4 seconds — ABNORMAL HIGH (ref 11.6–15.2)

## 2011-03-31 LAB — BASIC METABOLIC PANEL
CO2: 27 mEq/L (ref 19–32)
Calcium: 8.7 mg/dL (ref 8.4–10.5)
Creatinine, Ser: 0.64 mg/dL (ref 0.50–1.10)
GFR calc Af Amer: >60 mL/min (ref >60)

## 2011-04-01 LAB — CBC
Hemoglobin: 10.1 g/dL — ABNORMAL LOW (ref 12.0–15.0)
MCH: 32.9 pg (ref 26.0–34.0)
MCHC: 33.6 g/dL (ref 30.0–36.0)

## 2011-04-01 LAB — BASIC METABOLIC PANEL
BUN: 11 mg/dL (ref 6–23)
Chloride: 100 mEq/L (ref 96–112)
Creatinine, Ser: 0.74 mg/dL (ref 0.50–1.10)
GFR calc Af Amer: >60 mL/min (ref >60)
Glucose, Bld: 92 mg/dL (ref 70–99)

## 2011-04-01 LAB — PROTIME-INR: Prothrombin Time: 16 seconds — ABNORMAL HIGH (ref 11.6–15.2)

## 2011-04-02 LAB — CBC
MCH: 32.8 pg (ref 26.0–34.0)
MCHC: 33.2 g/dL (ref 30.0–36.0)
MCV: 98.7 fL (ref 78.0–100.0)
Platelets: 218 10*3/uL (ref 150–400)
RDW: 12.4 % (ref 11.5–15.5)

## 2011-04-02 LAB — BASIC METABOLIC PANEL
BUN: 11 mg/dL (ref 6–23)
CO2: 34 mEq/L — ABNORMAL HIGH (ref 19–32)
Calcium: 8.8 mg/dL (ref 8.4–10.5)
Creatinine, Ser: 0.67 mg/dL (ref 0.50–1.10)
Glucose, Bld: 100 mg/dL — ABNORMAL HIGH (ref 70–99)

## 2011-04-11 NOTE — H&P (Signed)
NAMENEWELL, FRATER NO.:  [id number]  MEDICAL RECORD NO.:  [id number]  LOCATION:  [facility]                         FACILITY:  [facility]  PHYSICIAN:  Manson Passey, MD        DATE OF BIRTH:  11-20-1923  DATE OF ADMISSION:  03/27/2011 DATE OF DISCHARGE:                             HISTORY & PHYSICAL   CONSULTATION:  Internal Medicine, Hospitalist.  PRIMARY CARE PHYSICIAN:  Dr. Corbin Ade, phone number, [phone].  CHIEF COMPLAINT:  Status post fall.  HISTORY OF PRESENT ILLNESS:  77 year old female with history of hypertension, hypothyroidism, diverticulitis, ischemic colitis, cystitis, dyslipidemia, lower GI bleed, gastroesophageal reflux disease, breast cancer status post lumpectomy, resident of [facility], was brought to emergency department status post fall.  The patient reports she was standing at the sink and suddenly felt weakness in left extremity and then she fell.  The patient reports no prodromal symptoms of dizziness, lightheadedness, or palpitations.  The patient never lost consciousness.  No chest pain, no cough, no fever.  No nausea or vomiting.  No blood in the stool or urine; no burning sensation on urination.  Review of systems as per HPI.  ALLERGIES:  MORPHINE.  PAST MEDICAL HISTORY: 1. Hypertension. 2. Hypothyroidism. 3. Diverticulitis. 4. Ischemic colitis. 5. Right breast ductal carcinoma in situ in 2004, status post     chemotherapy. 6. Osteoarthritis. 7. Osteoporosis. 8. Schatzki ring. 9. Lower GI bleed.  PAST SURGICAL HISTORY: 1. Lumpectomy. 2. C4-C6 foraminotomies in 2006. 3. Left knee arthroscopy, then total knee replacement. 4. Shoulder arthroscopy, then total shoulder arthroplasty. 5. Partial excision of the right great toe, proximal phalanx head.  SOCIAL HISTORY:  The patient is a smoker a 50-pack per day history, quit 17 years ago; no illegal drug abuse or alcohol abuse.  FAMILY HISTORY:  Significant for  father, who is deceased, died of MI at the age of 56.  Mother deceased of unknown cause.  MEDICATIONS: 1. Norvasc 5 mg daily. 2. Enalapril 5 mg daily. 3. Hydrochlorothiazide 25 mg daily. 4. Vitamin D3. 5. Alphagan eye drops. 6. Levothyroxine 88 mcg daily. 7. Protonix 40 mg daily. 8. Potassium chloride 40 mEq twice a day. 9. ReQuip 0.5 mg daily. 10.Ativan 0.5 mg as needed. 11.Robaxin 500 mg p.o. q.6 h. as needed.  PHYSICAL EXAMINATION:  VITAL SIGNS:  Blood pressure 126/68, pulse 79, respirations 17, temperature 98.3 Fahrenheit, oxygen saturation 96% on 2 L nasal cannula. GENERAL APPEARANCE:  No acute distress, appears to be uncomfortable due to pain. LUNGS:  Bilateral air entry; clear to auscultation; no wheezing, rhonchi, or rales appreciated. CARDIOVASCULAR:  S1 and S2 sound audible, regular rate and rhythm; no murmurs or gallops. ABDOMEN:  Positive bowel sounds, soft, nontender/nondistended. SKIN:  Warm, dry; ecchymosis over upper extremities. NECK:  Supple, no lymphadenopathy.  No JVD. EXTREMITIES:  Pulses are palpable bilaterally, no lower extremity edema. MUSCULOSKELETAL:  Limited range of motion in both lower extremities due to pain. NEUROLOGIC:  Alert, awake, oriented x3; no focal neurologic deficits.  LABORATORY VALUES:  March 28, 2011, white blood cells 8.5, hemoglobin 13, platelets 217.  Sodium 139, potassium 4.1, chloride 104, bicarb 28, BUN 19, creatinine 1.03, glucose  116, calcium 9.9. Urinalysis, negative.  INR 1.05.  DIAGNOSTIC IMAGING STUDIES:  March 28, 2011, x-ray of left hip shows mildly comminuted intertrochanteric fracture involving the proximal left femur.  March 28, 2011, chest x-ray, no active lung disease.  ASSESSMENT AND PLAN:  This is a pleasant 77 year old female with history of hypertension, hypothyroidism, diverticulitis, ischemic colitis, cystitis, dyslipidemia, lower gastrointestinal bleed, gastroesophageal reflux disease,  breast cancer in remission, who was brought to the emergency department status post fall.  IMPRESSION: 1. Status post fall and left proximal femoral fracture.  Spoke with     Dr. Corbin Ade at [phone], and it was told the patient has no major cardiovascular risk factors, no pulmonary risk factors as the patient quit smoking 17 years ago.  The patient has acceptable level of risk for the procedure and can be considered medically optimized for the orthopedic procedure. 1. Hypertension, well controlled on current home medications, Norvasc,     enalapril, and HCTZ.  May continue same medications     postoperatively. 2. Hypothyroidism.  Continue Synthroid 88 mcg daily. 3. Gastroesophageal reflux disease - Protonix 40 mg daily. 4. Anxiety.  Ativan can be continued 0.5 mg q.12 h. as needed. 5. Advance directive, the patient is DNR. 6. We will follow up on this patient postoperatively to reassess her     medical condition.  More than 60 minutes had been spent on     consultation.          ______________________________ Manson Passey, MD     AD/MEDQ  D:  03/28/2011  T:  03/28/2011  Job:  [id number]  Electronically Signed by Manson Passey MD on 04/11/2011 05:22:36 PM

## 2011-04-21 NOTE — Op Note (Signed)
NAMEAILED, DEFIBAUGH NO.:  [id number]  MEDICAL RECORD NO.:  [id number]  LOCATION:  1342                         FACILITY:  [facility]  PHYSICIAN:  Almedia Balls. Ranell Patrick, M.D. DATE OF BIRTH:  [birth year]  DATE OF PROCEDURE:  03/29/2011 DATE OF DISCHARGE:                              OPERATIVE REPORT   PREOPERATIVE DIAGNOSIS:  Left displaced basicervical hip fracture.  POSTOPERATIVE DIAGNOSIS:  Left displaced basicervical hip fracture.  PROCEDURE PERFORMED:  Left hip ORIF with DePuy troch entry nail short with anti-rotation screw and a lag screw into the head.  ATTENDING SURGEON:  Malon Kindle, MD  ASSISTANT:  None.  ANESTHESIA:  General anesthesia was used.  ESTIMATED BLOOD LOSS:  150 cc.  FLUID REPLACEMENT:  1000 cc.  URINE OUTPUT:  250 cc.  INSTRUMENT COUNT:  Correct.  COMPLICATIONS:  There were no complications.  Preoperative antibiotics were given.  INDICATIONS:  Patient is an 87-year-female with a history of a fall from ground level after her leg gave out secondary to spinal stenosis.  She landed on her left hip breaking that.  She presented from [facility] with a displaced basicervical/high intertroch fracture.  The patient has a displaced fracture.  Discussed with her the need to realign her hip fracture and stabilize it.  She agreed to the surgery.  Informed consent was obtained.  DESCRIPTION OF PROCEDURE:  After adequate level of anesthesia achieved, the patient was positioned on the fracture table.  Perineal post utilized.  Right leg placed and modified in lithotomy position.  Pulses were checked.  Left leg was placed in traction boot and internally rotated slightly to obtain an anatomic reduction on multiple views with C-arm.  Then sterilely prepped and draped the left hip and thigh in the usual manner, placed a sterile shower drape.  We then went ahead and made a lateral skin incision proximal to greater trochanter.   Dissection down through subcu tissues using Bovie.  The tensor fascia over the fascia was split using the Bovie.  We identified the greater trochanter, identified appropriate starting point with a guide pin on multiple views and then placed a step-cut entry drill into the femur.  We then went ahead and placed 11 mm x 130 degrees short troch entry nail from DePuy across the fracture site.  Visualizing in multiple planes we had a good reduction.  We went ahead with depth point, placed into appropriate depth and placed a guide pin for lag screw level in the femoral neck. This was visualized on the AP and lateral views and in appropriate position.  We then drilled for the lag screw and then placed a second pin superior to that which was now rotation pin while we placed our lag screw under direct visualization.  Once that lag screw was in place, fracture reduction again maintained in good position.  We went ahead and placed that more superior screw which is going to serve as an rotation screw.  That particular screw was 80 mm where the lag screw was 90 mm. We then went ahead and compressed the fracture site to eliminate the gap.  There was a slight gap with traction, we took the  traction off and used slight amount of compression, gained good apposition of bone, and then went ahead and locked in that sliding screw, so that it would not slide and serve more as a fixed angled type construct.  We were happy with our reduction, felt like that reduction was excellent and we did not want that to collapse.  At this point, we went ahead and placed our distal screw with the jig and then once that was confirmed we removed the jig, took our final x-rays, thoroughly irrigated all wounds, and closed with layered closure with Vicryl and staples for skin.  The sterile Mepilex was applied to all wounds and the patient was taken out of the lithotomy position with her right leg.  Pulse was again checked. We  removed the perineal post and the patient was transferred safely on the recovery room stretcher in stable condition.     Almedia Balls. Ranell Patrick, M.D.     SRN/MEDQ  D:  03/29/2011  T:  03/30/2011  Job:  [id number]  Electronically Signed by Malon Kindle  on 04/21/2011 03:10:47 AM

## 2011-04-21 NOTE — Discharge Summary (Signed)
  NAMEANNETTE, Castro NO.:  [id number]  MEDICAL RECORD NO.:  [id number]  LOCATION:  1342                         FACILITY:  [facility]  PHYSICIAN:  Almedia Balls. Ranell Patrick, M.D. DATE OF BIRTH:  [birth year]  DATE OF ADMISSION:  03/27/2011 DATE OF DISCHARGE:  04/03/2011                              DISCHARGE SUMMARY   ADMISSION DIAGNOSIS:  Left displaced femur fracture.  DISCHARGE DIAGNOSIS:  Left displaced femur fracture, status post open reduction and internal fixation.  BRIEF HISTORY:  The patient is an 77 year old female who sustained a ground-level fall on March 28, 2011.  The patient was admitted for surgical management, which was completed on September 12, to decrease pain while function to restore that left leg.  PROCEDURE:  The patient had a left hip ORIF using an IM nailing short by Dr. Malon Kindle on March 29, 2011.  ANESTHESIA:  General anesthesia was used.  COMPLICATIONS:  No complications.  HOSPITAL COURSE:  The patient was admitted on March 28, 2011, for the above-stated procedure which she tolerated well.  After adequate time in postanesthesia care unit, she was transferred up back up to [facility] [place].  Postoperative day 1, the complained of moderate pain to the left hip, was able to work gently with physical therapy and occupational therapy.  Neurovascularly, she is intact.  Labs within acceptable limits.  She was afebrile.  She was recommended to return back to a skilled nursing facility once she was medically and orthopedically stable.  Thus, we did give her several days with physical therapy and recovered from this hip surgery before sending her back to her care center.  The patient overall did quite well with the hospital stay. Wound was healing well.  LABORATORY DATA:  Labs within acceptable limits.  DISCHARGE/PLAN:  The patient will be discharged to a skilled nursing facility on April 02, 2016.  CONDITION:  Her condition is  stable.  DIET:  Her diet is regular.  ALLERGIES:  The patient has no known drug allergies.  DISCHARGE MEDICATIONS: 1. Norvasc 5 mg daily. 2. Alphagan 1 drop each eye daily. 3. Vasotec 5 mg daily. 4. Ferrous sulfate 325 mg p.o. t.i.d. 5. Hydrochlorothiazide 25 mg daily. 6. Levothyroxine 88 mcg daily. 7. Protonix 40 mg b.i.d. 8. Lidoderm patch 0.5% daily as needed. 9. Protonix 40 mg b.i.d. 10.MiraLax 17 grams daily with water as needed. 11.K-Dur 40 mEq every 4 hours. 12.Requip 0.5 mg daily. 13.Robaxin 500 mg p.o. q.6 hours. 14.Percocet 5/325 1-2 tablets q.4-6 hours p.r.n. pain.  FOLLOWUP:  The patient will follow up back up with Dr. Malon Kindle in 2 weeks.     Thomas B. Dixon, P.A.   ______________________________ Almedia Balls. Ranell Patrick, M.D.    TBD/MEDQ  D:  03/30/2011  T:  03/30/2011  Job:  [id number]  Electronically Signed by Standley Dakins P.A. on 04/05/2011 08:49:24 AM Electronically Signed by Malon Kindle  on 04/21/2011 03:10:44 AM

## 2011-04-25 LAB — CBC
MCHC: 33.5
MCHC: 34.4
MCV: 90.6
Platelets: 151
Platelets: 182
RBC: 3.12 — ABNORMAL LOW
RBC: 4.1
RDW: 14.8
WBC: 5.3
WBC: 5.8
WBC: 6.1

## 2011-04-25 LAB — BASIC METABOLIC PANEL
BUN: 14
BUN: 9
CO2: 29
CO2: 31
Calcium: 8.6
Calcium: 8.6
Calcium: 9.3
Chloride: 107
Creatinine, Ser: 1
Creatinine, Ser: 1.15
Creatinine, Ser: 1.47 — ABNORMAL HIGH
GFR calc Af Amer: 41 — ABNORMAL LOW
GFR calc Af Amer: >60
GFR calc non Af Amer: 34 — ABNORMAL LOW
GFR calc non Af Amer: 50 — ABNORMAL LOW
Glucose, Bld: 169 — ABNORMAL HIGH

## 2011-04-25 LAB — URINALYSIS, ROUTINE W REFLEX MICROSCOPIC
Ketones, ur: NEGATIVE
Specific Gravity, Urine: 1.009

## 2011-04-25 LAB — APTT: aPTT: 29

## 2011-04-25 LAB — PROTIME-INR
INR: 3.3 — ABNORMAL HIGH
INR: 3.3 — ABNORMAL HIGH
INR: 0.9
INR: 1.3
INR: 2.2 — ABNORMAL HIGH
Prothrombin Time: 34.9 — ABNORMAL HIGH
Prothrombin Time: 35.1 — ABNORMAL HIGH
Prothrombin Time: 12.5
Prothrombin Time: 13.3
Prothrombin Time: 24.8 — ABNORMAL HIGH

## 2011-04-25 LAB — DIFFERENTIAL
Lymphs Abs: 1.8
Monocytes Relative: 10
Neutro Abs: 3.2
Neutrophils Relative %: 55

## 2011-04-25 LAB — ABO/RH: ABO/RH(D): A POS

## 2011-04-25 LAB — TYPE AND SCREEN
ABO/RH(D): A POS
Antibody Screen: NEGATIVE

## 2011-04-25 LAB — URINE MICROSCOPIC-ADD ON

## 2011-07-06 ENCOUNTER — Other Ambulatory Visit: Payer: Self-pay | Admitting: Geriatric Medicine

## 2011-07-06 ENCOUNTER — Ambulatory Visit
Admission: RE | Admit: 2011-07-06 | Discharge: 2011-07-06 | Disposition: A | Discharge status: Home / Self Care | Payer: Medicare Other | Source: Ambulatory Visit | Attending: Geriatric Medicine | Admitting: Geriatric Medicine

## 2011-07-06 DIAGNOSIS — R609 Edema, unspecified: Secondary | ICD-10-CM

## 2011-12-04 IMAGING — CT CT ABD-PELV W/ CM
3 of 5 series · 12 of 36 positions shown, 18 images · IV contrast (READICAT/WATER & [ID] OMNI [id number])
Comparison: CT abdomen pelvis of 04/25/2010

CLINICAL DATA: Right flank pain, history breast carcinoma, some
weight loss

CT ABDOMEN AND PELVIS WITH CONTRAST
TECHNIQUE: Multidetector CT imaging of the abdomen and pelvis was
performed following the standard protocol during bolus
administration of intravenous contrast.
Contrast: 100 ml [id number]

[Series 3: routine abdomen · axial · 0.76mm/px · z∈[-283,+17]mm · 7 of 80 slices shown, 12 images]
[im 10/80  soft-tissue]
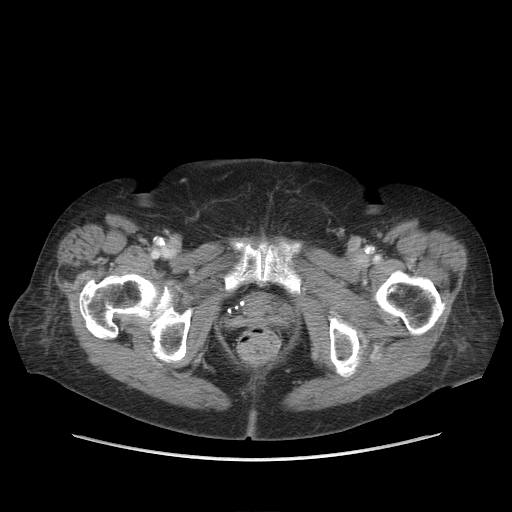
[im 10/80  bone]
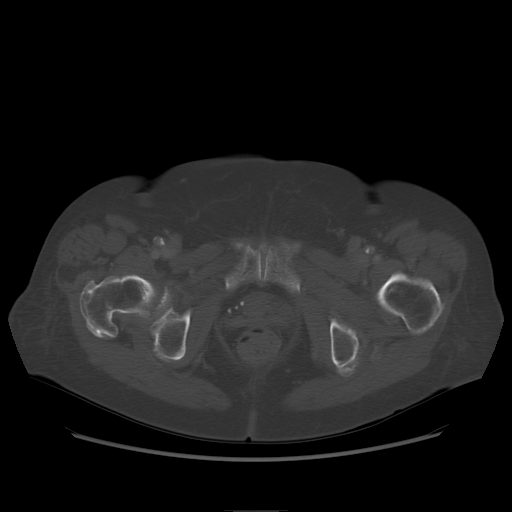
[im 20/80  soft-tissue]
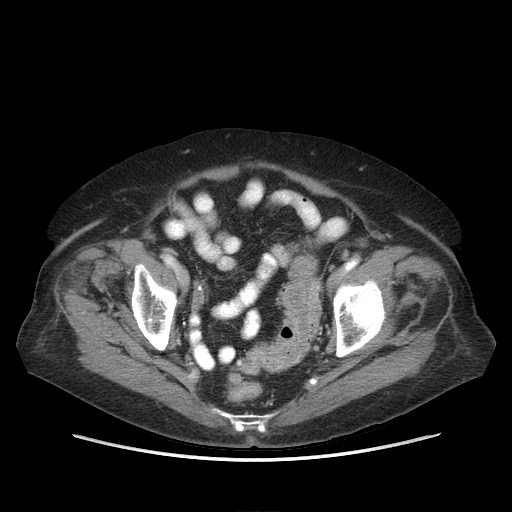
[im 30/80  soft-tissue]
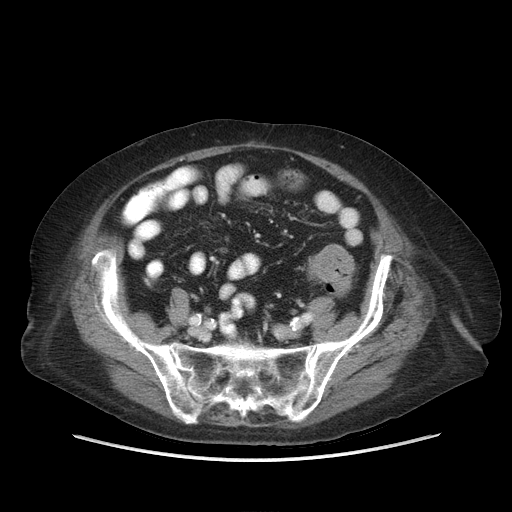
[im 40/80  soft-tissue]
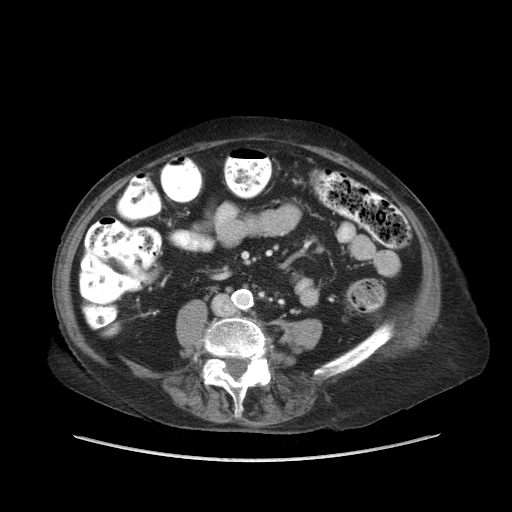
[im 40/80  lung]
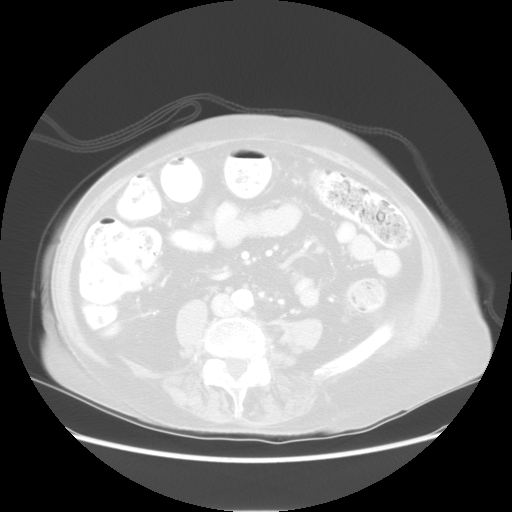
[im 50/80  soft-tissue]
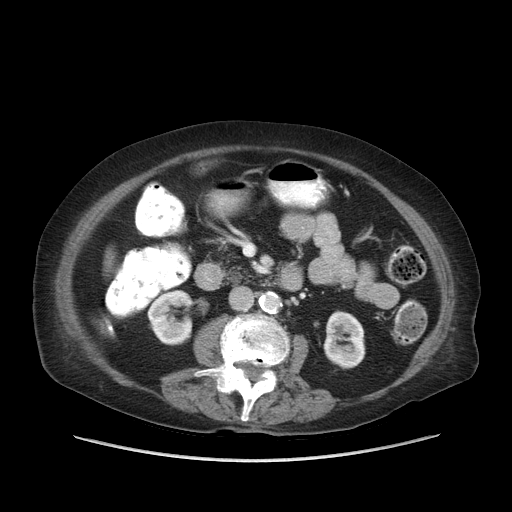
[im 50/80  lung]
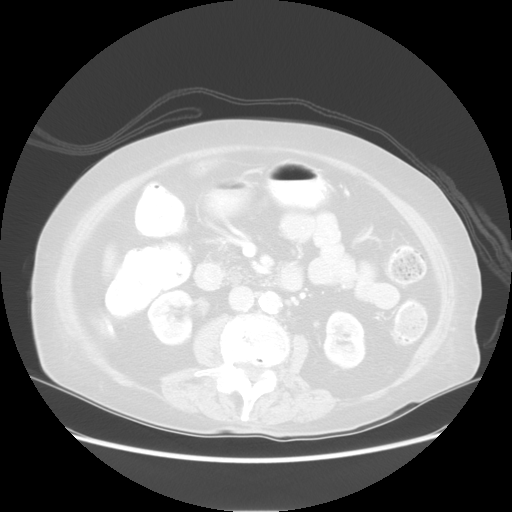
[im 60/80  soft-tissue]
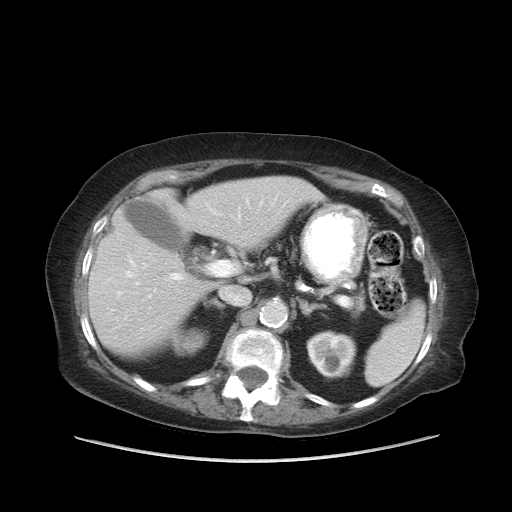
[im 60/80  lung]
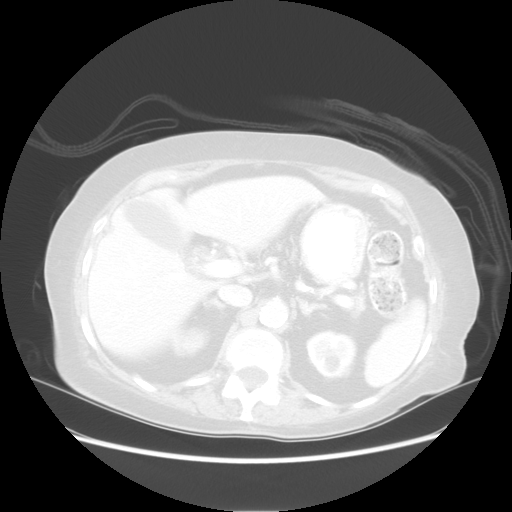
[im 70/80  soft-tissue]
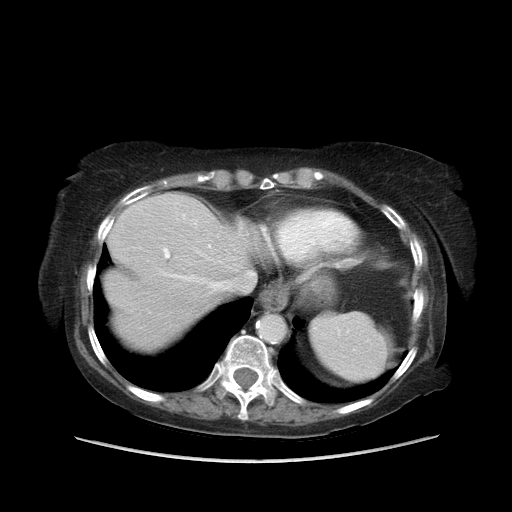
[im 70/80  lung]
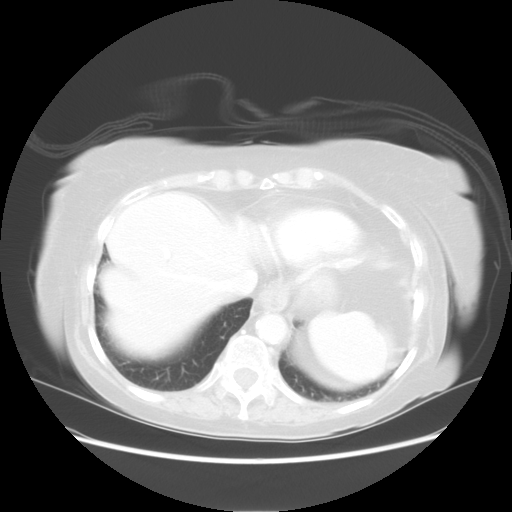

[Series 601: coronal body · coronal · 0.81mm/px · 1 of 104 slices shown, 2 images]
[im 35/104  soft-tissue]
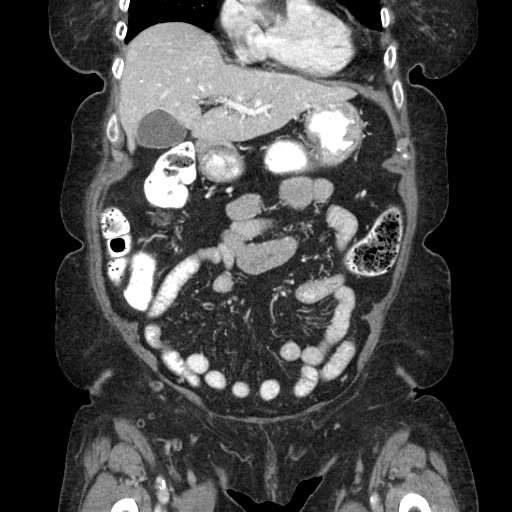
[im 35/104  bone]
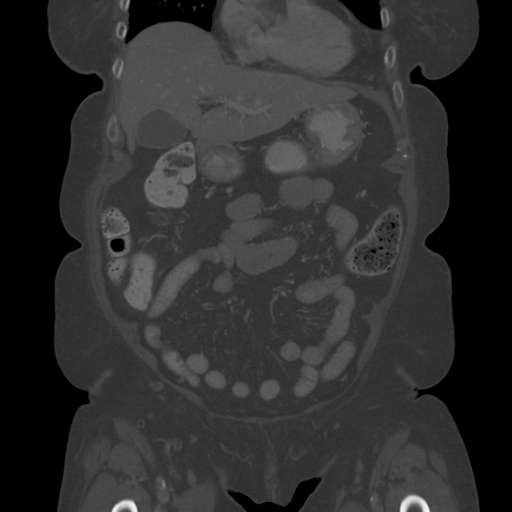

[Series 602: sagittal body · sagittal · 0.81mm/px · 4 of 151 slices shown]
[im 18/151  soft-tissue]
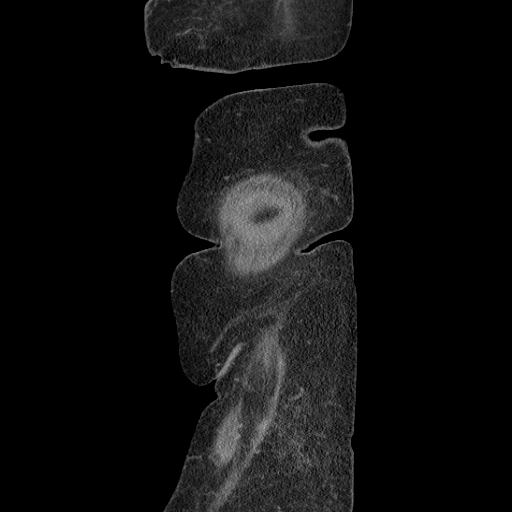
[im 36/151  soft-tissue]
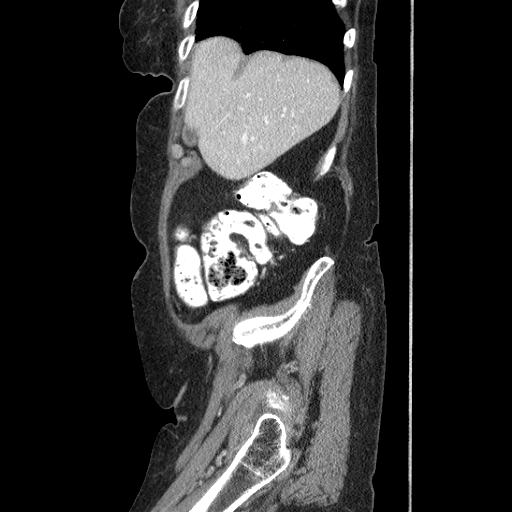
[im 53/151  soft-tissue]
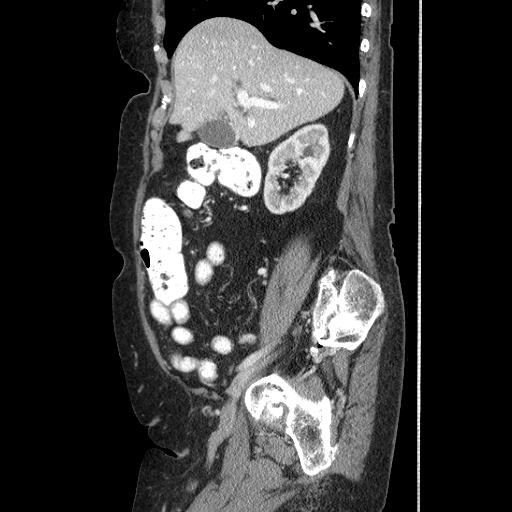
[im 71/151  soft-tissue]
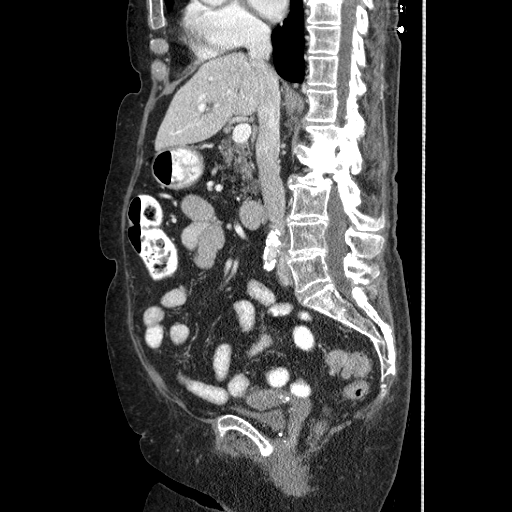

[12 of 36 positions shown; findings below may reference images not displayed]

FINDINGS: The lung bases are clear.  There is mild cardiomegaly
present.  No focal hepatic abnormality is seen with probable tiny
sub centimeter hepatic cysts present particularly in the left lobe.
No calcified gallstones are seen.  The pancreas is fatty
infiltrated and the pancreatic duct is not dilated.  The adrenal
glands and spleen are unremarkable.  A cyst emanates from the upper
pole of the left kidney and is stable, as are smaller renal cysts
bilaterally.  No renal calculi are seen and there is no evidence of
hydronephrosis.  Moderate atheromatous change is noted throughout
the abdominal aorta but no aneurysm is seen.  No adenopathy is
noted.

The urinary bladder is decompressed.  The uterus is normal in size
for age.  No adnexal lesion is seen.  No fluid is noted within the
pelvis.  There are rectosigmoid colonic diverticula present with
changes of diverticulitis.  Very minimal strandiness is noted of
the pericolonic fat planes near the junction of the distal
descending and sigmoid colon where the patient previously had
diverticulitis in 1144.  These changes most likely reflect
scarring, but very mild diverticulitis is difficult to exclude
currently.  No complicating features are seen.  The terminal ileum
is unremarkable as is the appendix.  There is a lumbar scoliosis
present with diffuse degenerative change.
IMPRESSION: 1.  Sigmoid colon diverticulosis.  Minimal strandiness near the
juncture of the distal descending and sigmoid colon probably
related to prior [place] of diverticulitis.  Doubt current
diverticulitis.
2.  Cardiomegaly.

## 2012-06-07 ENCOUNTER — Other Ambulatory Visit: Payer: Self-pay | Admitting: Geriatric Medicine

## 2012-06-07 DIAGNOSIS — R51 Headache: Secondary | ICD-10-CM

## 2012-06-11 ENCOUNTER — Ambulatory Visit
Admission: RE | Admit: 2012-06-11 | Discharge: 2012-06-11 | Disposition: A | Discharge status: Home / Self Care | Payer: Medicare Other | Source: Ambulatory Visit | Attending: Geriatric Medicine | Admitting: Geriatric Medicine

## 2012-06-11 DIAGNOSIS — R51 Headache: Secondary | ICD-10-CM

## 2012-06-11 MED ORDER — IOHEXOL 300 MG/ML  SOLN
75.0000 mL | Freq: Once | INTRAMUSCULAR | Status: AC | PRN
  Administered 2012-06-11: 75 mL via INTRAVENOUS

## 2012-07-15 IMAGING — CR DG CHEST 1V
1 series · 1 of 1 positions shown · non-contrast
Comparison: Chest radiograph performed 12/28/2010

CLINICAL DATA: Status post fall; concern for chest injury.

CHEST - 1 VIEW

[x chest ap]
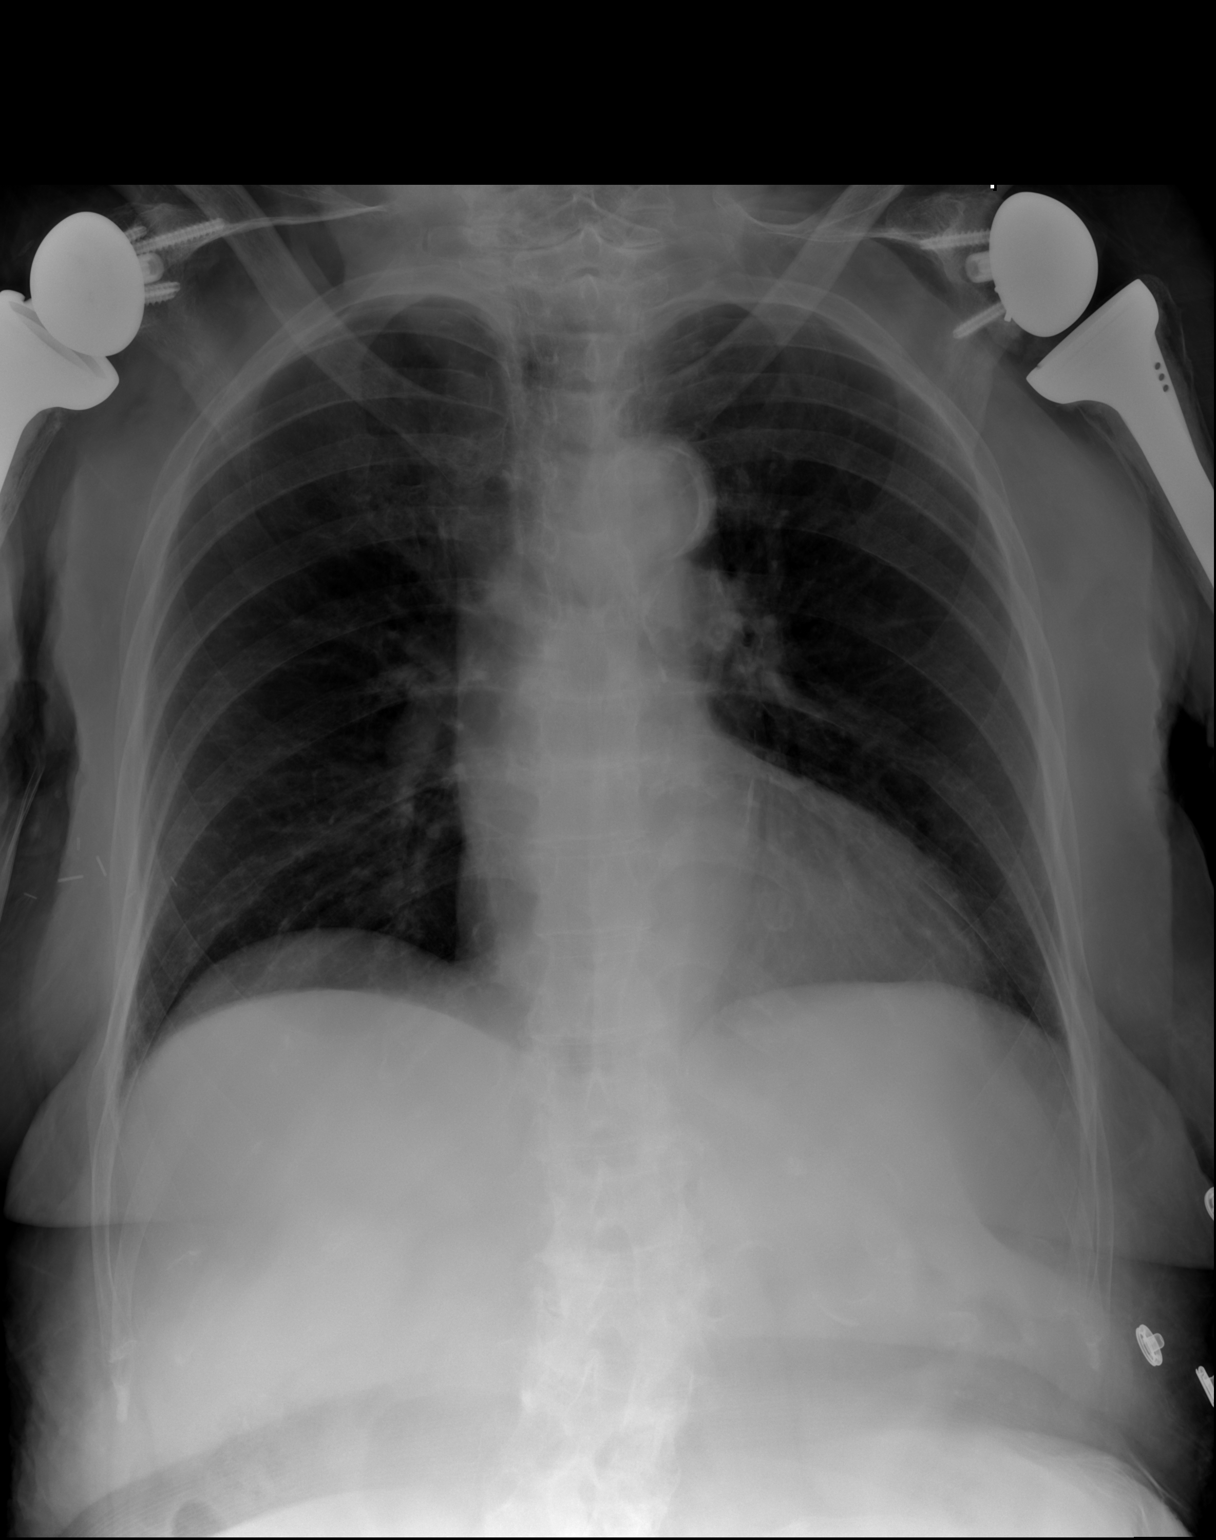

[1 of 1 positions shown; findings below may reference images not displayed]

FINDINGS: The lungs are well-aerated and clear.  There is no
evidence of focal opacification, pleural effusion or pneumothorax.

The cardiomediastinal silhouette is normal in size; calcification
is noted within the aortic arch.  No acute osseous abnormalities
are seen.  Scattered clips are noted overlying the right axilla.
Bilateral shoulder arthroplasties appear grossly unremarkable.
IMPRESSION: No displaced rib fractures seen; no acute cardiopulmonary process
identified.

## 2012-11-21 ENCOUNTER — Other Ambulatory Visit: Payer: Self-pay | Admitting: Geriatric Medicine

## 2012-11-21 DIAGNOSIS — R1310 Dysphagia, unspecified: Secondary | ICD-10-CM

## 2012-11-25 ENCOUNTER — Ambulatory Visit
Admission: RE | Admit: 2012-11-25 | Discharge: 2012-11-25 | Disposition: A | Discharge status: Home / Self Care | Payer: Medicare Other | Source: Ambulatory Visit | Attending: Geriatric Medicine | Admitting: Geriatric Medicine

## 2012-11-25 DIAGNOSIS — R1310 Dysphagia, unspecified: Secondary | ICD-10-CM

## 2012-12-02 ENCOUNTER — Other Ambulatory Visit: Payer: Medicare Other

## 2013-03-20 ENCOUNTER — Other Ambulatory Visit: Payer: Self-pay | Admitting: Geriatric Medicine

## 2013-03-20 DIAGNOSIS — R109 Unspecified abdominal pain: Secondary | ICD-10-CM

## 2013-03-25 ENCOUNTER — Ambulatory Visit
Admission: RE | Admit: 2013-03-25 | Discharge: 2013-03-25 | Disposition: A | Discharge status: Home / Self Care | Payer: Medicare Other | Source: Ambulatory Visit | Attending: Geriatric Medicine | Admitting: Geriatric Medicine

## 2013-03-25 DIAGNOSIS — R109 Unspecified abdominal pain: Secondary | ICD-10-CM

## 2013-04-03 ENCOUNTER — Other Ambulatory Visit: Payer: Self-pay | Admitting: Gastroenterology

## 2013-04-03 DIAGNOSIS — K559 Vascular disorder of intestine, unspecified: Secondary | ICD-10-CM

## 2013-04-08 ENCOUNTER — Ambulatory Visit
Admission: RE | Admit: 2013-04-08 | Discharge: 2013-04-08 | Disposition: A | Discharge status: Home / Self Care | Payer: Medicare Other | Source: Ambulatory Visit | Attending: Gastroenterology | Admitting: Gastroenterology

## 2013-04-08 DIAGNOSIS — K559 Vascular disorder of intestine, unspecified: Secondary | ICD-10-CM

## 2013-04-08 MED ORDER — IOHEXOL 350 MG/ML SOLN
80.0000 mL | Freq: Once | INTRAVENOUS | Status: AC | PRN
  Administered 2013-04-08: 80 mL via INTRAVENOUS

## 2013-07-18 ENCOUNTER — Other Ambulatory Visit: Payer: Self-pay | Admitting: Internal Medicine

## 2013-07-18 DIAGNOSIS — N6320 Unspecified lump in the left breast, unspecified quadrant: Secondary | ICD-10-CM

## 2013-07-23 ENCOUNTER — Ambulatory Visit
Admission: RE | Admit: 2013-07-23 | Discharge: 2013-07-23 | Disposition: A | Discharge status: Home / Self Care | Payer: Medicare Other | Source: Ambulatory Visit | Attending: Internal Medicine | Admitting: Internal Medicine

## 2013-07-23 ENCOUNTER — Other Ambulatory Visit: Payer: Self-pay | Admitting: Internal Medicine

## 2013-07-23 DIAGNOSIS — N6320 Unspecified lump in the left breast, unspecified quadrant: Secondary | ICD-10-CM

## 2013-08-05 ENCOUNTER — Encounter (INDEPENDENT_AMBULATORY_CARE_PROVIDER_SITE_OTHER): Payer: Self-pay | Admitting: General Surgery

## 2013-08-05 ENCOUNTER — Ambulatory Visit (INDEPENDENT_AMBULATORY_CARE_PROVIDER_SITE_OTHER): Payer: Medicare Other | Admitting: General Surgery

## 2013-08-05 ENCOUNTER — Encounter (INDEPENDENT_AMBULATORY_CARE_PROVIDER_SITE_OTHER): Payer: Self-pay

## 2013-08-05 VITALS — BP 122/68 | HR 68 | Temp 98.0°F | Resp 18 | Ht 62.0 in | Wt 104.0 lb

## 2013-08-05 DIAGNOSIS — C50219 Malignant neoplasm of upper-inner quadrant of unspecified female breast: Secondary | ICD-10-CM

## 2013-08-05 DIAGNOSIS — C50212 Malignant neoplasm of upper-inner quadrant of left female breast: Secondary | ICD-10-CM | POA: Insufficient documentation

## 2013-08-05 NOTE — Progress Notes (Signed)
Patient ID: [name], female   DOB: 10/28/1923, 80 y.o.   MRN: [id number]  Chief Complaint  Patient presents with  . Establish Care    left breast    HPI [name] is a 80 y.o. female.  Referred by Dr Elizabeth Eagle HPI 80-year-old female who has a history of a right breast cancer treated by Dr. Streck with lumpectomy and radiation therapy. She is otherwise fairly healthy. She has no nipple discharge but she felt a mass in her left breast in mid December. She ended up having this evaluated by mammography as well as ultrasound. The mammogram showed a 1.9 cm left upper inner quadrant spiculated mass. The right side was normal and showed lumpectomy changes. Ultrasound showed a 1.8 x 1.6 x 1.6 cm mass. This underwent a biopsy that shows an invasive ductal carcinoma. This is ER positive at 100%. PR positive at 40%. Proliferation markers 20% and HER-2/neu is negative. She comes in today to discuss her options. She has frequent urination now and also has some difficulty swallowing. Past Medical History  Diagnosis Date  . Hypertension   . Cancer     breast    Past Surgical History  Procedure Laterality Date  . Breast surgery  1995    rt breast ca  . Shoulder arthroscopy with labral repair      bilateral shoulder w/rod  . Hand surgery      bilateral hands  . Knee fusion    . Hip pinning    . Ear skin      History reviewed. No pertinent family history.  Social History History  Substance Use Topics  . Smoking status: Never Smoker   . Smokeless tobacco: Not on file  . Alcohol Use: No    No Known Allergies  Current Outpatient Prescriptions  Medication Sig Dispense Refill  . alendronate (FOSAMAX) 35 MG tablet       . amLODipine (NORVASC) 5 MG tablet       . aspirin 81 MG tablet Take 81 mg by mouth daily.      . brimonidine (ALPHAGAN) 0.2 % ophthalmic solution       . hydrOXYzine (VISTARIL) 25 MG capsule       . ketoconazole (NIZORAL) 2 % cream       . levothyroxine  (SYNTHROID, LEVOTHROID) 112 MCG tablet       . mirtazapine (REMERON) 15 MG tablet       . omeprazole (PRILOSEC) 20 MG capsule       . polyethylene glycol powder (GLYCOLAX/MIRALAX) powder       . rOPINIRole (REQUIP) 0.5 MG tablet       . ciprofloxacin (CIPRO) 250 MG tablet       . metroNIDAZOLE (FLAGYL) 250 MG tablet       . oxyCODONE-acetaminophen (PERCOCET) 10-325 MG per tablet       . OXYCONTIN 30 MG T12A 20 mg.        No current facility-administered medications for this visit.    Review of Systems Review of Systems  Constitutional: Negative for fever, chills and unexpected weight change.  HENT: Positive for hearing loss. Negative for congestion, sore throat, trouble swallowing and voice change.   Eyes: Negative for visual disturbance.  Respiratory: Negative for cough and wheezing.   Cardiovascular: Negative for chest pain, palpitations and leg swelling.  Gastrointestinal: Negative for nausea, vomiting, abdominal pain, diarrhea, constipation, blood in stool, abdominal distention and anal bleeding.  Genitourinary: Negative for hematuria, vaginal bleeding   and difficulty urinating.  Musculoskeletal: Negative for arthralgias.  Skin: Negative for rash and wound.  Neurological: Negative for seizures, syncope and headaches.  Hematological: Negative for adenopathy. Does not bruise/bleed easily.  Psychiatric/Behavioral: Negative for confusion.    Blood pressure 122/68, pulse 68, temperature 98 F (36.7 C), resp. rate 18, height 5' 2" (1.575 m), weight 104 lb (47.174 kg).  Physical Exam Physical Exam  Vitals reviewed. Constitutional: She appears well-developed and well-nourished.  Neck: Neck supple.  Cardiovascular: Normal rate, regular rhythm and normal heart sounds.   Pulmonary/Chest: Effort normal and breath sounds normal. She has no wheezes. She has no rales. Right breast exhibits no inverted nipple, no mass, no nipple discharge, no skin change and no tenderness. Left breast  exhibits mass. Left breast exhibits no inverted nipple, no nipple discharge, no skin change and no tenderness.    Lymphadenopathy:    She has no cervical adenopathy.    Data Reviewed ADDENDUM:  Pathology results: Pathology results from the left breast biopsy  revealed a grade 2 invasive ductal carcinoma. This is concordant  with the imaging findings. Diane Rasol, the patient's nurse, has  been notified of the results. She will talk to the patient later  about the biopsy results. She reports the patient is doing well and  denies any biopsy site complications.  She has a followup appointment with Dr. Denvil Canning 08/05/2013 at 2  p.m. Ms. Rasol has been instructed to call [facility] with  any questions or concerns.  Electronically Signed  By: Jennifer Jarosz M.D.  On: 07/24/2013 12:08       Study Result    CLINICAL DATA: Patient has felt a lump in the left breast for 3  weeks. She underwent right lumpectomy and radiation therapy for  breast cancer in 2003.  EXAM:  DIGITAL DIAGNOSTIC BILATERAL MAMMOGRAM WITH CAD  ULTRASOUND LEFT BREAST  COMPARISON: 02/21/2008, 07/24/2007, and 01/08/2007  ACR Breast Density Category a: The breast tissue is almost entirely  fatty.  FINDINGS:  There is a spiculated mass in the left upper inner quadrant at  middle depth which measures 1.9 cm. Right lumpectomy changes are  present. No other abnormality is noted.  Mammographic images were processed with CAD.  On physical exam, there is a palpable mass at 10:30 o'clock, 3 cm  from the left nipple.  Ultrasound is performed, showing an irregular mass at 10:30 o'clock,  3 cm from the left nipple measuring 1.8 x 1.6 x 1.6 cm. The  appearance is highly suspicious for invasive mammary carcinoma. No  abnormal nodes are noted in the left axilla.  The patient and I discussed biopsy options. I suggested  ultrasound-guided core needle biopsy to obtain a diagnosis and  possible receptor status which  would allow the patient to consider  various options.  The patient agreed with this plan.  IMPRESSION:  Highly suspicious spiculated mass in the left upper inner quadrant.  RECOMMENDATION:  Ultrasound-guided core needle biopsy is suggested. This will be  performed and reported separately.  I have discussed the findings and recommendations with the patient.  Results were also provided in writing at the conclusion of the  visit. Results were discussed with Jackie, Dr. Jaralla's assistant,  by telephone.  BI-RADS CATEGORY 5: Highly suggestive of malignancy - appropriate  action should be taken.     Assessment    Clinical stage II left breast cancer     Plan    Left total mastectomy     She initially was   interested in a bilateral mastectomy but I have talked to her about the fact that is absolutely no benefit in her. We are going to proceed with a left total mastectomy. I don't think she needs a node evaluation at the same time as I think she can just be treated with antiestrogen therapy postoperatively. We discussed the risks and benefits associated with this surgery. We discussed the postoperative hospital stay in the hospital course. She will be going to the care center facility afterwards. I'm going to discuss this with Dr. Stoneking and then we will proceed.   Lelah Rennaker 08/05/2013, 2:40 PM    

## 2013-08-18 ENCOUNTER — Telehealth (INDEPENDENT_AMBULATORY_CARE_PROVIDER_SITE_OTHER): Payer: Self-pay

## 2013-08-18 NOTE — Telephone Encounter (Signed)
LM requesting the status of medical clearance. [name] stated they received the clearance note on 1/20 when i faxed it to them but was not under the impression that anything needed to be faxed back to [place]. I clearly stated that the medical clearance request for the doctor to sign the form or fax [place] a written note that she has been cleared for surgery. [name] will send [name] a note requesting the clearance now.

## 2013-08-19 NOTE — Telephone Encounter (Signed)
Called Margreta Journey again at Dr Carlyle Lipa office. Margreta Journey told me that the clearance is sitting on his desk for him to review and it will get back to [place] ASAP. I asked if she would send another message to Dr Felipa Eth about the clearance and she said yes. I am just still waiting for the clearance.

## 2013-08-22 NOTE — Telephone Encounter (Signed)
Called pt to notify her that we received cardiac clearance from Dr Felipa Eth. I advised her that we were sending her surgical orders to surgery scheduling today. The pt understands.

## 2013-08-28 ENCOUNTER — Encounter (INDEPENDENT_AMBULATORY_CARE_PROVIDER_SITE_OTHER): Payer: Self-pay

## 2013-09-02 ENCOUNTER — Encounter (HOSPITAL_COMMUNITY): Payer: Self-pay | Admitting: *Deleted

## 2013-09-02 HISTORY — PX: TOTAL MASTECTOMY: SHX6129

## 2013-09-02 MED ORDER — CEFAZOLIN SODIUM-DEXTROSE 2-3 GM-% IV SOLR
2.0000 g | INTRAVENOUS | Status: AC
  Administered 2013-09-03: 2 g via INTRAVENOUS
  Filled 2013-09-02: qty 50

## 2013-09-02 NOTE — Progress Notes (Signed)
Pt doesn't have a cardiologist  Denies ever having an echo/stress test/heart cath  Medical Md is Dr.Hal Stoneking  Denies EKG or CXR in past yr

## 2013-09-03 ENCOUNTER — Observation Stay (HOSPITAL_COMMUNITY)
Admission: RE | Admit: 2013-09-03 | Discharge: 2013-09-05 | Disposition: A | Discharge status: Skilled Nursing Facility | Payer: Medicare Other | Source: Ambulatory Visit | Attending: General Surgery | Admitting: General Surgery

## 2013-09-03 ENCOUNTER — Encounter (HOSPITAL_COMMUNITY): Payer: Medicare Other | Admitting: Certified Registered"

## 2013-09-03 ENCOUNTER — Ambulatory Visit (HOSPITAL_COMMUNITY): Payer: Medicare Other

## 2013-09-03 ENCOUNTER — Ambulatory Visit (HOSPITAL_COMMUNITY): Payer: Medicare Other | Admitting: Certified Registered"

## 2013-09-03 ENCOUNTER — Ambulatory Visit: Admit: 2013-09-03 | Payer: Self-pay | Admitting: General Surgery

## 2013-09-03 ENCOUNTER — Encounter (HOSPITAL_COMMUNITY): Payer: Self-pay | Admitting: *Deleted

## 2013-09-03 ENCOUNTER — Encounter (HOSPITAL_COMMUNITY)
Admission: RE | Disposition: A | Discharge status: Skilled Nursing Facility | Payer: Self-pay | Source: Ambulatory Visit | Attending: General Surgery

## 2013-09-03 ENCOUNTER — Encounter (HOSPITAL_COMMUNITY): Payer: Self-pay | Admitting: Pharmacy Technician

## 2013-09-03 DIAGNOSIS — Z7983 Long term (current) use of bisphosphonates: Secondary | ICD-10-CM | POA: Insufficient documentation

## 2013-09-03 DIAGNOSIS — M545 Low back pain, unspecified: Secondary | ICD-10-CM | POA: Insufficient documentation

## 2013-09-03 DIAGNOSIS — D0590 Unspecified type of carcinoma in situ of unspecified breast: Secondary | ICD-10-CM | POA: Insufficient documentation

## 2013-09-03 DIAGNOSIS — Z923 Personal history of irradiation: Secondary | ICD-10-CM | POA: Insufficient documentation

## 2013-09-03 DIAGNOSIS — E039 Hypothyroidism, unspecified: Secondary | ICD-10-CM | POA: Insufficient documentation

## 2013-09-03 DIAGNOSIS — R1310 Dysphagia, unspecified: Secondary | ICD-10-CM | POA: Insufficient documentation

## 2013-09-03 DIAGNOSIS — I10 Essential (primary) hypertension: Secondary | ICD-10-CM | POA: Insufficient documentation

## 2013-09-03 DIAGNOSIS — Z853 Personal history of malignant neoplasm of breast: Secondary | ICD-10-CM | POA: Insufficient documentation

## 2013-09-03 DIAGNOSIS — Z7982 Long term (current) use of aspirin: Secondary | ICD-10-CM | POA: Insufficient documentation

## 2013-09-03 DIAGNOSIS — K219 Gastro-esophageal reflux disease without esophagitis: Secondary | ICD-10-CM | POA: Insufficient documentation

## 2013-09-03 DIAGNOSIS — Z170 Estrogen receptor positive status [ER+]: Secondary | ICD-10-CM | POA: Insufficient documentation

## 2013-09-03 DIAGNOSIS — C50219 Malignant neoplasm of upper-inner quadrant of unspecified female breast: Principal | ICD-10-CM | POA: Insufficient documentation

## 2013-09-03 DIAGNOSIS — Z79899 Other long term (current) drug therapy: Secondary | ICD-10-CM | POA: Insufficient documentation

## 2013-09-03 DIAGNOSIS — R350 Frequency of micturition: Secondary | ICD-10-CM | POA: Insufficient documentation

## 2013-09-03 DIAGNOSIS — Z87891 Personal history of nicotine dependence: Secondary | ICD-10-CM | POA: Insufficient documentation

## 2013-09-03 DIAGNOSIS — Z9010 Acquired absence of unspecified breast and nipple: Secondary | ICD-10-CM

## 2013-09-03 HISTORY — DX: Age-related osteoporosis without current pathological fracture: M81.0

## 2013-09-03 HISTORY — DX: Dysphagia, unspecified: R13.10

## 2013-09-03 HISTORY — DX: Unspecified osteoarthritis, unspecified site: M19.90

## 2013-09-03 HISTORY — DX: Gastro-esophageal reflux disease without esophagitis: K21.9

## 2013-09-03 HISTORY — DX: Frequency of micturition: R35.0

## 2013-09-03 HISTORY — DX: Insomnia, unspecified: G47.00

## 2013-09-03 HISTORY — DX: Unspecified macular degeneration: H35.30

## 2013-09-03 HISTORY — DX: Other complications of anesthesia, initial encounter: T88.59XA

## 2013-09-03 HISTORY — DX: Noninfective gastroenteritis and colitis, unspecified: K52.9

## 2013-09-03 HISTORY — DX: Pain in unspecified joint: M25.50

## 2013-09-03 HISTORY — DX: Nocturia: R35.1

## 2013-09-03 HISTORY — DX: Effusion, unspecified joint: M25.40

## 2013-09-03 HISTORY — DX: Personal history of other infectious and parasitic diseases: Z86.19

## 2013-09-03 HISTORY — DX: Diverticulosis of intestine, part unspecified, without perforation or abscess without bleeding: K57.90

## 2013-09-03 HISTORY — DX: Diverticulitis of intestine, part unspecified, without perforation or abscess without bleeding: K57.92

## 2013-09-03 HISTORY — DX: Unspecified glaucoma: H40.9

## 2013-09-03 HISTORY — DX: Low back pain: M54.5

## 2013-09-03 HISTORY — DX: Adverse effect of unspecified anesthetic, initial encounter: T41.45XA

## 2013-09-03 HISTORY — DX: Hypothyroidism, unspecified: E03.9

## 2013-09-03 HISTORY — DX: Constipation, unspecified: K59.00

## 2013-09-03 HISTORY — PX: SIMPLE MASTECTOMY WITH AXILLARY SENTINEL NODE BIOPSY: SHX6098

## 2013-09-03 HISTORY — DX: Low back pain, unspecified: M54.50

## 2013-09-03 LAB — COMPREHENSIVE METABOLIC PANEL
ALT: 8 U/L (ref 0–35)
AST: 20 U/L (ref 0–37)
Albumin: 3.7 g/dL (ref 3.5–5.2)
Alkaline Phosphatase: 94 U/L (ref 39–117)
BUN: 18 mg/dL (ref 6–23)
CALCIUM: 9.4 mg/dL (ref 8.4–10.5)
CO2: 24 mEq/L (ref 19–32)
CREATININE: 0.95 mg/dL (ref 0.50–1.10)
Chloride: 106 mEq/L (ref 96–112)
GFR calc Af Amer: 61 mL/min — ABNORMAL LOW (ref >90)
GFR calc non Af Amer: 52 mL/min — ABNORMAL LOW (ref >90)
Glucose, Bld: 100 mg/dL — ABNORMAL HIGH (ref 70–99)
Potassium: 4.5 mEq/L (ref 3.7–5.3)
Sodium: 144 mEq/L (ref 137–147)
TOTAL PROTEIN: 7.1 g/dL (ref 6.0–8.3)
Total Bilirubin: <0.2 mg/dL — ABNORMAL LOW (ref 0.3–1.2)

## 2013-09-03 LAB — CBC WITH DIFFERENTIAL/PLATELET
BASOS ABS: 0 10*3/uL (ref 0.0–0.1)
Basophils Relative: 0 % (ref 0–1)
EOS ABS: 0.3 10*3/uL (ref 0.0–0.7)
EOS PCT: 4 % (ref 0–5)
HCT: 39.2 % (ref 36.0–46.0)
Hemoglobin: 12.9 g/dL (ref 12.0–15.0)
LYMPHS PCT: 31 % (ref 12–46)
Lymphs Abs: 2.2 10*3/uL (ref 0.7–4.0)
MCH: 32.3 pg (ref 26.0–34.0)
MCHC: 32.9 g/dL (ref 30.0–36.0)
MCV: 98.2 fL (ref 78.0–100.0)
Monocytes Absolute: 0.5 10*3/uL (ref 0.1–1.0)
Monocytes Relative: 7 % (ref 3–12)
Neutro Abs: 3.9 10*3/uL (ref 1.7–7.7)
Neutrophils Relative %: 57 % (ref 43–77)
PLATELETS: 184 10*3/uL (ref 150–400)
RBC: 3.99 MIL/uL (ref 3.87–5.11)
RDW: 13.6 % (ref 11.5–15.5)
WBC: 6.9 10*3/uL (ref 4.0–10.5)

## 2013-09-03 SURGERY — SIMPLE MASTECTOMY
Anesthesia: General | Laterality: Left

## 2013-09-03 MED ORDER — PROPOFOL 10 MG/ML IV BOLUS
INTRAVENOUS | Status: DC | PRN
  Administered 2013-09-03: 100 mg via INTRAVENOUS
  Administered 2013-09-03: 30 mg via INTRAVENOUS

## 2013-09-03 MED ORDER — FENTANYL CITRATE 0.05 MG/ML IJ SOLN
INTRAMUSCULAR | Status: DC | PRN
  Administered 2013-09-03 (×2): 50 ug via INTRAVENOUS
  Administered 2013-09-03: 100 ug via INTRAVENOUS
  Administered 2013-09-03: 50 ug via INTRAVENOUS

## 2013-09-03 MED ORDER — PROPOFOL 10 MG/ML IV BOLUS
INTRAVENOUS | Status: AC
  Filled 2013-09-03: qty 20

## 2013-09-03 MED ORDER — OXYCODONE HCL 5 MG PO TABS: 5.0000 mg | ORAL_TABLET | Freq: Once | ORAL | Status: DC | PRN

## 2013-09-03 MED ORDER — OXYCODONE HCL 5 MG/5ML PO SOLN: 5.0000 mg | Freq: Once | ORAL | Status: DC | PRN

## 2013-09-03 MED ORDER — OXYCODONE-ACETAMINOPHEN 10-325 MG PO TABS: 1.0000 | ORAL_TABLET | Freq: Four times a day (QID) | ORAL | Status: DC | PRN

## 2013-09-03 MED ORDER — FENTANYL CITRATE 0.05 MG/ML IJ SOLN
INTRAMUSCULAR | Status: AC
  Filled 2013-09-03: qty 2

## 2013-09-03 MED ORDER — AMLODIPINE BESYLATE 5 MG PO TABS
5.0000 mg | ORAL_TABLET | Freq: Every day | ORAL | Status: DC
  Administered 2013-09-03 – 2013-09-05 (×3): 5 mg via ORAL
  Filled 2013-09-03 (×3): qty 1

## 2013-09-03 MED ORDER — HYDROMORPHONE HCL PF 1 MG/ML IJ SOLN
INTRAMUSCULAR | Status: AC
  Filled 2013-09-03: qty 1

## 2013-09-03 MED ORDER — MIDAZOLAM HCL 2 MG/2ML IJ SOLN: 1.0000 mg | INTRAMUSCULAR | Status: DC | PRN

## 2013-09-03 MED ORDER — ONDANSETRON HCL 4 MG/2ML IJ SOLN: 4.0000 mg | Freq: Four times a day (QID) | INTRAMUSCULAR | Status: DC | PRN

## 2013-09-03 MED ORDER — HYDROMORPHONE HCL PF 1 MG/ML IJ SOLN: 0.5000 mg | INTRAMUSCULAR | Status: DC | PRN

## 2013-09-03 MED ORDER — OXYCODONE HCL ER 10 MG PO T12A
30.0000 mg | EXTENDED_RELEASE_TABLET | Freq: Two times a day (BID) | ORAL | Status: DC
  Administered 2013-09-03 – 2013-09-05 (×4): 30 mg via ORAL
  Filled 2013-09-03 (×4): qty 3

## 2013-09-03 MED ORDER — ACETAMINOPHEN 325 MG PO TABS
650.0000 mg | ORAL_TABLET | Freq: Four times a day (QID) | ORAL | Status: DC | PRN
  Administered 2013-09-05: 650 mg via ORAL
  Filled 2013-09-03: qty 2

## 2013-09-03 MED ORDER — OXYCODONE HCL ER 30 MG PO T12A: 20.0000 mg | EXTENDED_RELEASE_TABLET | Freq: Two times a day (BID) | ORAL | Status: DC

## 2013-09-03 MED ORDER — FENTANYL CITRATE 0.05 MG/ML IJ SOLN
25.0000 ug | INTRAMUSCULAR | Status: DC | PRN
  Administered 2013-09-03 (×3): 50 ug via INTRAVENOUS

## 2013-09-03 MED ORDER — LIDOCAINE HCL (CARDIAC) 20 MG/ML IV SOLN
INTRAVENOUS | Status: DC | PRN
  Administered 2013-09-03: 80 mg via INTRAVENOUS

## 2013-09-03 MED ORDER — FENTANYL CITRATE 0.05 MG/ML IJ SOLN: 50.0000 ug | Freq: Once | INTRAMUSCULAR | Status: DC

## 2013-09-03 MED ORDER — BRIMONIDINE TARTRATE 0.2 % OP SOLN
1.0000 [drp] | Freq: Two times a day (BID) | OPHTHALMIC | Status: DC
  Administered 2013-09-03 – 2013-09-05 (×4): 1 [drp] via OPHTHALMIC
  Filled 2013-09-03: qty 5

## 2013-09-03 MED ORDER — 0.9 % SODIUM CHLORIDE (POUR BTL) OPTIME
TOPICAL | Status: DC | PRN
  Administered 2013-09-03: 1000 mL

## 2013-09-03 MED ORDER — OXYCODONE-ACETAMINOPHEN 5-325 MG PO TABS
1.0000 | ORAL_TABLET | Freq: Four times a day (QID) | ORAL | Status: DC | PRN
  Administered 2013-09-03 – 2013-09-05 (×5): 1 via ORAL
  Filled 2013-09-03 (×6): qty 1

## 2013-09-03 MED ORDER — MIRTAZAPINE 15 MG PO TABS
15.0000 mg | ORAL_TABLET | Freq: Every day | ORAL | Status: DC
  Administered 2013-09-03 – 2013-09-04 (×2): 15 mg via ORAL
  Filled 2013-09-03 (×3): qty 1

## 2013-09-03 MED ORDER — SODIUM CHLORIDE 0.9 % IV SOLN
INTRAVENOUS | Status: DC
  Administered 2013-09-03: 18:00:00 via INTRAVENOUS

## 2013-09-03 MED ORDER — PANTOPRAZOLE SODIUM 40 MG PO TBEC
40.0000 mg | DELAYED_RELEASE_TABLET | Freq: Every day | ORAL | Status: DC
  Administered 2013-09-03 – 2013-09-05 (×3): 40 mg via ORAL
  Filled 2013-09-03 (×3): qty 1

## 2013-09-03 MED ORDER — LEVOTHYROXINE SODIUM 112 MCG PO TABS
112.0000 ug | ORAL_TABLET | Freq: Every day | ORAL | Status: DC
  Administered 2013-09-04 – 2013-09-05 (×2): 112 ug via ORAL
  Filled 2013-09-03 (×3): qty 1

## 2013-09-03 MED ORDER — CEFAZOLIN SODIUM-DEXTROSE 2-3 GM-% IV SOLR
INTRAVENOUS | Status: AC
  Filled 2013-09-03: qty 50

## 2013-09-03 MED ORDER — OXYCODONE HCL 5 MG PO TABS
5.0000 mg | ORAL_TABLET | Freq: Four times a day (QID) | ORAL | Status: DC | PRN
  Administered 2013-09-04 – 2013-09-05 (×4): 5 mg via ORAL
  Filled 2013-09-03 (×5): qty 1

## 2013-09-03 MED ORDER — ROPINIROLE HCL 0.5 MG PO TABS
0.5000 mg | ORAL_TABLET | Freq: Every day | ORAL | Status: DC
  Administered 2013-09-04 – 2013-09-05 (×2): 0.5 mg via ORAL
  Filled 2013-09-03 (×2): qty 1

## 2013-09-03 MED ORDER — ACETAMINOPHEN 650 MG RE SUPP: 650.0000 mg | Freq: Four times a day (QID) | RECTAL | Status: DC | PRN

## 2013-09-03 MED ORDER — POLYETHYLENE GLYCOL 3350 17 G PO PACK
17.0000 g | PACK | Freq: Every day | ORAL | Status: DC
  Administered 2013-09-04: 17 g via ORAL
  Filled 2013-09-03 (×3): qty 1

## 2013-09-03 MED ORDER — LACTATED RINGERS IV SOLN
INTRAVENOUS | Status: DC
  Administered 2013-09-03: 13:00:00 via INTRAVENOUS

## 2013-09-03 MED ORDER — HYDROMORPHONE HCL PF 1 MG/ML IJ SOLN
0.2500 mg | INTRAMUSCULAR | Status: DC | PRN
  Administered 2013-09-03 (×4): 0.25 mg via INTRAVENOUS

## 2013-09-03 MED ORDER — LABETALOL HCL 5 MG/ML IV SOLN
INTRAVENOUS | Status: DC | PRN
  Administered 2013-09-03: 10 mg via INTRAVENOUS

## 2013-09-03 MED ORDER — FENTANYL CITRATE 0.05 MG/ML IJ SOLN
INTRAMUSCULAR | Status: AC
  Filled 2013-09-03: qty 5

## 2013-09-03 SURGICAL SUPPLY — 42 items
ADH SKN CLS APL DERMABOND .7 (GAUZE/BANDAGES/DRESSINGS) ×1
APPLIER CLIP 9.375 MED OPEN (MISCELLANEOUS) ×3
APR CLP MED 9.3 20 MLT OPN (MISCELLANEOUS) ×1
BINDER BREAST LRG (GAUZE/BANDAGES/DRESSINGS) ×3 IMPLANT
BINDER BREAST XLRG (GAUZE/BANDAGES/DRESSINGS) IMPLANT
BIOPATCH RED 1 DISK 7.0 (GAUZE/BANDAGES/DRESSINGS) ×2 IMPLANT
BIOPATCH RED 1IN DISK 7.0MM (GAUZE/BANDAGES/DRESSINGS) ×1
CHLORAPREP W/TINT 26ML (MISCELLANEOUS) ×3 IMPLANT
CLIP APPLIE 9.375 MED OPEN (MISCELLANEOUS) ×1 IMPLANT
CLOSURE WOUND 1/2 X4 (GAUZE/BANDAGES/DRESSINGS) ×1
COVER SURGICAL LIGHT HANDLE (MISCELLANEOUS) ×3 IMPLANT
DERMABOND ADVANCED (GAUZE/BANDAGES/DRESSINGS) ×2
DERMABOND ADVANCED .7 DNX12 (GAUZE/BANDAGES/DRESSINGS) ×1 IMPLANT
DRAIN CHANNEL 19F RND (DRAIN) ×3 IMPLANT
DRAPE LAPAROSCOPIC ABDOMINAL (DRAPES) ×3 IMPLANT
DRAPE UTILITY 15X26 W/TAPE STR (DRAPE) ×6 IMPLANT
ELECT BLADE 4.0 EZ CLEAN MEGAD (MISCELLANEOUS) ×3
ELECT REM PT RETURN 9FT ADLT (ELECTROSURGICAL) ×3
ELECTRODE BLDE 4.0 EZ CLN MEGD (MISCELLANEOUS) ×1 IMPLANT
ELECTRODE REM PT RTRN 9FT ADLT (ELECTROSURGICAL) ×1 IMPLANT
EVACUATOR SILICONE 100CC (DRAIN) ×3 IMPLANT
GLOVE BIO SURGEON STRL SZ7 (GLOVE) ×3 IMPLANT
GLOVE BIOGEL PI IND STRL 7.5 (GLOVE) ×1 IMPLANT
GLOVE BIOGEL PI INDICATOR 7.5 (GLOVE) ×2
GOWN STRL NON-REIN LRG LVL3 (GOWN DISPOSABLE) ×9 IMPLANT
KIT BASIN OR (CUSTOM PROCEDURE TRAY) ×3 IMPLANT
KIT ROOM TURNOVER OR (KITS) ×3 IMPLANT
NS IRRIG 1000ML POUR BTL (IV SOLUTION) ×3 IMPLANT
PACK GENERAL/GYN (CUSTOM PROCEDURE TRAY) ×3 IMPLANT
PAD ARMBOARD 7.5X6 YLW CONV (MISCELLANEOUS) ×3 IMPLANT
SPONGE GAUZE 4X4 12PLY (GAUZE/BANDAGES/DRESSINGS) ×3 IMPLANT
SPONGE LAP 18X18 X RAY DECT (DISPOSABLE) ×3 IMPLANT
STRIP CLOSURE SKIN 1/2X4 (GAUZE/BANDAGES/DRESSINGS) ×2 IMPLANT
SUT ETHILON 2 0 FS 18 (SUTURE) ×6 IMPLANT
SUT MON AB 4-0 PC3 18 (SUTURE) ×3 IMPLANT
SUT SILK 2 0 (SUTURE) ×3
SUT SILK 2 0 SH (SUTURE) ×6 IMPLANT
SUT SILK 2-0 18XBRD TIE 12 (SUTURE) ×1 IMPLANT
SUT VIC AB 3-0 SH 18 (SUTURE) ×3 IMPLANT
SUT VIC AB 3-0 SH 8-18 (SUTURE) ×6 IMPLANT
TOWEL OR 17X24 6PK STRL BLUE (TOWEL DISPOSABLE) ×3 IMPLANT
TOWEL OR 17X26 10 PK STRL BLUE (TOWEL DISPOSABLE) ×3 IMPLANT

## 2013-09-03 NOTE — H&P (View-Only) (Signed)
Patient ID: [name], female   DOB: 12-08-1923, 80 y.o.   MRN: [id number]  Chief Complaint  Patient presents with  . Establish Care    left breast    HPI [name] is a 80 y.o. female.  Referred by Dr Ulyess Blossom HPI 80 year old female who has a history of a right breast cancer treated by Dr. Margot Chimes with lumpectomy and radiation therapy. She is otherwise fairly healthy. She has no nipple discharge but she felt a mass in her left breast in mid December. She ended up having this evaluated by mammography as well as ultrasound. The mammogram showed a 1.9 cm left upper inner quadrant spiculated mass. The right side was normal and showed lumpectomy changes. Ultrasound showed a 1.8 x 1.6 x 1.6 cm mass. This underwent a biopsy that shows an invasive ductal carcinoma. This is ER positive at 100%. PR positive at 40%. Proliferation markers 20% and HER-2/neu is negative. She comes in today to discuss her options. She has frequent urination now and also has some difficulty swallowing. Past Medical History  Diagnosis Date  . Hypertension   . Cancer     breast    Past Surgical History  Procedure Laterality Date  . Breast surgery  1995    rt breast ca  . Shoulder arthroscopy with labral repair      bilateral shoulder w/rod  . Hand surgery      bilateral hands  . Knee fusion    . Hip pinning    . Ear skin      History reviewed. No pertinent family history.  Social History History  Substance Use Topics  . Smoking status: Never Smoker   . Smokeless tobacco: Not on file  . Alcohol Use: No    No Known Allergies  Current Outpatient Prescriptions  Medication Sig Dispense Refill  . alendronate (FOSAMAX) 35 MG tablet       . amLODipine (NORVASC) 5 MG tablet       . aspirin 81 MG tablet Take 81 mg by mouth daily.      . brimonidine (ALPHAGAN) 0.2 % ophthalmic solution       . hydrOXYzine (VISTARIL) 25 MG capsule       . ketoconazole (NIZORAL) 2 % cream       . levothyroxine  (SYNTHROID, LEVOTHROID) 112 MCG tablet       . mirtazapine (REMERON) 15 MG tablet       . omeprazole (PRILOSEC) 20 MG capsule       . polyethylene glycol powder (GLYCOLAX/MIRALAX) powder       . rOPINIRole (REQUIP) 0.5 MG tablet       . ciprofloxacin (CIPRO) 250 MG tablet       . metroNIDAZOLE (FLAGYL) 250 MG tablet       . oxyCODONE-acetaminophen (PERCOCET) 10-325 MG per tablet       . OXYCONTIN 30 MG T12A 20 mg.        No current facility-administered medications for this visit.    Review of Systems Review of Systems  Constitutional: Negative for fever, chills and unexpected weight change.  HENT: Positive for hearing loss. Negative for congestion, sore throat, trouble swallowing and voice change.   Eyes: Negative for visual disturbance.  Respiratory: Negative for cough and wheezing.   Cardiovascular: Negative for chest pain, palpitations and leg swelling.  Gastrointestinal: Negative for nausea, vomiting, abdominal pain, diarrhea, constipation, blood in stool, abdominal distention and anal bleeding.  Genitourinary: Negative for hematuria, vaginal bleeding  and difficulty urinating.  Musculoskeletal: Negative for arthralgias.  Skin: Negative for rash and wound.  Neurological: Negative for seizures, syncope and headaches.  Hematological: Negative for adenopathy. Does not bruise/bleed easily.  Psychiatric/Behavioral: Negative for confusion.    Blood pressure 122/68, pulse 68, temperature 98 F (36.7 C), resp. rate 18, height _0  (1.575 m), weight 104 lb (47.174 kg).  Physical Exam Physical Exam  Vitals reviewed. Constitutional: She appears well-developed and well-nourished.  Neck: Neck supple.  Cardiovascular: Normal rate, regular rhythm and normal heart sounds.   Pulmonary/Chest: Effort normal and breath sounds normal. She has no wheezes. She has no rales. Right breast exhibits no inverted nipple, no mass, no nipple discharge, no skin change and no tenderness. Left breast  exhibits mass. Left breast exhibits no inverted nipple, no nipple discharge, no skin change and no tenderness.    Lymphadenopathy:    She has no cervical adenopathy.    Data Reviewed ADDENDUM:  Pathology results: Pathology results from the left breast biopsy  revealed a grade 2 invasive ductal carcinoma. This is concordant  with the imaging findings. [name], the patient's nurse, has  been notified of the results. She will talk to the patient later  about the biopsy results. She reports the patient is doing well and  denies any biopsy site complications.  She has a followup appointment with Dr. Donne Hazel 08/05/2013 at 2  p.m. [name] has been instructed to call the [facility] with  any questions or concerns.  Electronically Signed  By: Everlean Alstrom M.D.  On: 07/24/2013 12:08       Study Result    CLINICAL DATA: Patient has felt a lump in the left breast for 3  weeks. She underwent right lumpectomy and radiation therapy for  breast cancer in 2003.  EXAM:  DIGITAL DIAGNOSTIC BILATERAL MAMMOGRAM WITH CAD  ULTRASOUND LEFT BREAST  COMPARISON: 02/21/2008, 07/24/2007, and 01/08/2007  ACR Breast Density Category a: The breast tissue is almost entirely  fatty.  FINDINGS:  There is a spiculated mass in the left upper inner quadrant at  middle depth which measures 1.9 cm. Right lumpectomy changes are  present. No other abnormality is noted.  Mammographic images were processed with CAD.  On physical exam, there is a palpable mass at 10:30 o'clock, 3 cm  from the left nipple.  Ultrasound is performed, showing an irregular mass at 10:30 o'clock,  3 cm from the left nipple measuring 1.8 x 1.6 x 1.6 cm. The  appearance is highly suspicious for invasive mammary carcinoma. No  abnormal nodes are noted in the left axilla.  The patient and I discussed biopsy options. I suggested  ultrasound-guided core needle biopsy to obtain a diagnosis and  possible receptor status which  would allow the patient to consider  various options.  The patient agreed with this plan.  IMPRESSION:  Highly suspicious spiculated mass in the left upper inner quadrant.  RECOMMENDATION:  Ultrasound-guided core needle biopsy is suggested. This will be  performed and reported separately.  I have discussed the findings and recommendations with the patient.  Results were also provided in writing at the conclusion of the  visit. Results were discussed with [name], [name] assistant,  by telephone.  BI-RADS CATEGORY 5: Highly suggestive of malignancy - appropriate  action should be taken.     Assessment    Clinical stage II left breast cancer     Plan    Left total mastectomy     She initially was  interested in a bilateral mastectomy but I have talked to her about the fact that is absolutely no benefit in her. We are going to proceed with a left total mastectomy. I don't think she needs a node evaluation at the same time as I think she can just be treated with antiestrogen therapy postoperatively. We discussed the risks and benefits associated with this surgery. We discussed the postoperative hospital stay in the hospital course. She will be going to the care center facility afterwards. I'm going to discuss this with Dr. Felipa Eth and then we will proceed.   [name] 08/05/2013, 2:40 PM

## 2013-09-03 NOTE — Op Note (Signed)
Preoperative diagnosis: Clinical stage II left breast cancer Postoperative diagnosis: Same as above Procedure: Left total mastectomy Surgeon: Dr. Serita Grammes Anesthesia: Gen. Estimated blood loss: 100 cc Drains: 19 [place] Blake drain Specimens: Left breast marked short stitch superior, long stitch lateral Sponge and needle count was correct at completion Complications: None Disposition to recovery stable  Indications: This is an 80 year old female has a prior history of a right breast cancer treated before. She has developed a fairly large left breast mass now. She lives in a facility. She does use a wheelchair. She has multiple comorbidities. We discussed all of her different options and decided to proceed with a left total mastectomy.  Procedure: After informed consent was obtained the patient was taken to the operating room. She was placed under anesthesia without complication. She was given cefazolin. Sequential compression devices were on her legs. She was then prepped and draped in the standard sterile surgical fashion. A surgical timeout was then performed.  I made an elliptical incision encompassing the tumor and her nipple areolar complex. I then created flaps to the clavicle, sternum, inframammary crease, and latissimus laterally.  Multiple small vessels were oversewn or cauterized. I then removed the breast tissue from the pectoralis muscle including the pectoralis fascia. This was then passed off the table as a specimen. She had a lot of oozing from everywhere. Eventually I  then obtained hemostasis. Irrigation was performed. I placed a 9 [place] Blake drain and secured this with a 2-0 nylon. I then closed this with 3-0 Vicryl, 4-0 Monocryl, and Dermabond and Steri-Strips. She tolerated this well was extubated and transferred to recovery in stable condition.

## 2013-09-03 NOTE — Anesthesia Postprocedure Evaluation (Signed)
  Anesthesia Post-op Note  Patient: [name]  Procedure(s) Performed: Procedure(s):  MASTECTOMY (Left)  Patient Location: PACU  Anesthesia Type:General  Level of Consciousness: awake and alert   Airway and Oxygen Therapy: Patient Spontanous Breathing  Post-op Pain: moderate  Post-op Assessment: Post-op Vital signs reviewed, Patient's Cardiovascular Status Stable, Respiratory Function Stable, Patent Airway and No signs of Nausea or vomiting  Post-op Vital Signs: Reviewed and stable  Complications: No apparent anesthesia complications

## 2013-09-03 NOTE — Progress Notes (Signed)
Notified MD about elevated blood pressure. Not action take at this time.

## 2013-09-03 NOTE — Anesthesia Preprocedure Evaluation (Signed)
Anesthesia Evaluation  Patient identified by MRN, date of birth, ID band Patient awake    Reviewed: Allergy & Precautions, H&P , NPO status , Patient's Chart, lab work & pertinent test results  Airway Mallampati: I TM Distance: <3 FB Neck ROM: Full    Dental   Pulmonary former smoker,  breath sounds clear to auscultation        Cardiovascular hypertension, Rhythm:Regular Rate:Normal     Neuro/Psych LBP    GI/Hepatic GERD-  ,dysarthria   Endo/Other  Hypothyroidism   Renal/GU      Musculoskeletal   Abdominal   Peds  Hematology   Anesthesia Other Findings Breast ca  Reproductive/Obstetrics                           Anesthesia Physical Anesthesia Plan  ASA: III  Anesthesia Plan: General   Post-op Pain Management:    Induction: Intravenous  Airway Management Planned: LMA  Additional Equipment:   Intra-op Plan:   Post-operative Plan: Extubation in OR  Informed Consent: I have reviewed the patients History and Physical, chart, labs and discussed the procedure including the risks, benefits and alternatives for the proposed anesthesia with the patient or authorized representative who has indicated his/her understanding and acceptance.     Plan Discussed with: CRNA and Surgeon  Anesthesia Plan Comments:         Anesthesia Quick Evaluation

## 2013-09-03 NOTE — Interval H&P Note (Signed)
History and Physical Interval Note:  09/03/2013 12:36 PM  [name]  has presented today for surgery, with the diagnosis of left breast cancer  The various methods of treatment have been discussed with the patient and family. After consideration of risks, benefits and other options for treatment, the patient has consented to  Procedure(s):  MASTECTOMY (Left) as a surgical intervention .  The patient's history has been reviewed, patient examined, no change in status, stable for surgery.  I have reviewed the patient's chart and labs.  Questions were answered to the patient's satisfaction.     Kavi Almquist

## 2013-09-03 NOTE — Transfer of Care (Signed)
Immediate Anesthesia Transfer of Care Note  Patient: [name]  Procedure(s) Performed: Procedure(s):  MASTECTOMY (Left)  Patient Location: PACU  Anesthesia Type:General  Level of Consciousness: awake and alert   Airway & Oxygen Therapy: Patient Spontanous Breathing and Patient connected to nasal cannula oxygen  Post-op Assessment: Report given to PACU RN and Post -op Vital signs reviewed and stable  Post vital signs: Reviewed and stable  Complications: No apparent anesthesia complications

## 2013-09-04 ENCOUNTER — Telehealth (INDEPENDENT_AMBULATORY_CARE_PROVIDER_SITE_OTHER): Payer: Self-pay

## 2013-09-04 DIAGNOSIS — C50912 Malignant neoplasm of unspecified site of left female breast: Secondary | ICD-10-CM

## 2013-09-04 LAB — CANCER ANTIGEN 27.29: CA 27.29: 20 U/mL (ref 0–39)

## 2013-09-04 MED ORDER — OXYCODONE-ACETAMINOPHEN 5-325 MG PO TABS: 1.0000 | ORAL_TABLET | Freq: Four times a day (QID) | ORAL | Status: DC | PRN

## 2013-09-04 NOTE — Progress Notes (Signed)
1 Day Post-Op  Subjective: Pain at surgical site, has not been up, has taken some clears  Objective: Vital signs in last 24 hours: Temp:  [97.7 F (36.5 C)-98.5 F (36.9 C)] 98.5 F (36.9 C) (02/19 0645) Pulse Rate:  [70-87] 85 (02/19 0645) Resp:  [12-25] 17 (02/19 0645) BP: (131-189)/(52-97) 164/84 mmHg (02/19 0645) SpO2:  [94 %-100 %] 96 % (02/19 0645) Weight:  [104 lb (47.174 kg)] 104 lb (47.174 kg) (02/18 1715)    Intake/Output from previous day: 02/18 0701 - 02/19 0700 In: 1505 [I.V.:1505] Out: 1815 [Urine:1525; Drains:140; Blood:150] Intake/Output this shift:    General appearance: no distress Resp: clear to auscultation bilaterally Cardio: regular rate and rhythm Incision/Wound:ecchymosis on superior flap but viable, drain with serosang fluid, no hematoma  Lab Results:   Recent Labs  09/03/13 1106  WBC 6.9  HGB 12.9  HCT 39.2  PLT 184   BMET  Recent Labs  09/03/13 1106  NA 144  K 4.5  CL 106  CO2 24  GLUCOSE 100*  BUN 18  CREATININE 0.95  CALCIUM 9.4   PT/INR No results found for this basename: LABPROT, INR,  in the last 72 hours ABG No results found for this basename: PHART, PCO2, PO2, HCO3,  in the last 72 hours  Studies/Results: Dg Chest 2 View  09/03/2013   CLINICAL DATA:  Preoperative evaluation for left mastectomy, history left breast cancer, hypertension, former smoker, GERD  EXAM: CHEST  2 VIEW  COMPARISON:  03/28/2011  FINDINGS: Upper normal heart size.  Densely calcified mildly tortuous thoracic aorta.  Mediastinal contours and pulmonary vascularity normal.  Emphysematous minimal bronchitic changes consistent with COPD.  Eventration anterior right diaphragm again noted.  No acute infiltrate, pleural effusion or pneumothorax.  Bilateral shoulder prostheses.  Surgical clips right breast.  Diffuse osseous demineralization.  IMPRESSION: COPD changes.  No acute abnormalities.   Electronically Signed   By: Lavonia Dana M.D.   On: 09/03/2013 12:22     Anti-infectives: Anti-infectives   Start     Dose/Rate Route Frequency Ordered Stop   09/03/13 0600  ceFAZolin (ANCEF) IVPB 2 g/50 mL premix     2 g 100 mL/hr over 30 Minutes Intravenous On call to O.R. 09/02/13 1349 09/03/13 1308      Assessment/Plan: POD 1 left mastectomy 1. Cont home pain med regimen and iv backup, don't know that she will be ready to go today 2. Pt evaluation for recs, she will go to care center of her facility 3. Monitor flaps 4. pulm toilet, oob 5. Advance diet.  [facility] 09/04/2013

## 2013-09-04 NOTE — Discharge Instructions (Signed)
[facility] °[phone] ° °MASTECTOMY: POST OP INSTRUCTIONS ° °Always review your discharge instruction sheet given to you by the facility where your surgery was performed. °IF YOU HAVE DISABILITY OR FAMILY LEAVE FORMS, YOU MUST BRING THEM TO THE OFFICE FOR PROCESSING.   °DO NOT GIVE THEM TO YOUR DOCTOR. °A prescription for pain medication may be given to you upon discharge.  Take your pain medication as prescribed, if needed.  If narcotic pain medicine is not needed, then you may take acetaminophen (Tylenol), naprosyn (Alleve) or ibuprofen (Advil) as needed. °1. Take your usually prescribed medications unless otherwise directed. °2. If you need a refill on your pain medication, please contact your pharmacy.  They will contact our office to request authorization.  Prescriptions will not be filled after 5pm or on week-ends. °3. You should follow a light diet the first few days after arrival home, such as soup and crackers, etc.  Resume your normal diet the day after surgery. °4. Most patients will experience some swelling and bruising on the chest and underarm.  Ice packs will help.  Swelling and bruising can take several days to resolve. Wear the binder day and night until you return to the office.  °5. It is common to experience some constipation if taking pain medication after surgery.  Increasing fluid intake and taking a stool softener (such as Colace) will usually help or prevent this problem from occurring.  A mild laxative (Milk of Magnesia or Miralax) should be taken according to package instructions if there are no bowel movements after 48 hours. °6. Unless discharge instructions indicate otherwise, leave your bandage dry and in place until your next appointment in 3-5 days.  You may take a limited sponge bath.  No tube baths or showers until the drains are removed.  You may have steri-strips (small skin tapes) in place directly over the incision.  These strips should be left on the  skin for 7-10 days. If you have glue it will come off in next couple week.  Any sutures will be removed at an office visit °7. DRAINS:  If you have drains in place, it is important to keep a list of the amount of drainage produced each day in your drains.  Before leaving the hospital, you should be instructed on drain care.  Call our office if you have any questions about your drains. I will remove your drains when they put out less than 30 cc or ml for 2 consecutive days. °8. ACTIVITIES:  You may resume regular (light) daily activities beginning the next day--such as daily self-care, walking, climbing stairs--gradually increasing activities as tolerated.  You may have sexual intercourse when it is comfortable.  Refrain from any heavy lifting or straining until approved by your doctor. °a. You may drive when you are no longer taking prescription pain medication, you can comfortably wear a seatbelt, and you can safely maneuver your car and apply brakes. °b. RETURN TO WORK:  __________________________________________________________ °9. You should see your doctor in the office for a follow-up appointment approximately 3-5 days after your surgery.  Your doctor’s nurse will typically make your follow-up appointment when she calls you with your pathology report.  Expect your pathology report 3-4business days after surgery. °10. OTHER INSTRUCTIONS: ______________________________________________________________________________________________ ____________________________________________________________________________________________ °WHEN TO CALL YOUR DR Tyla Burgner: °1. Fever over 101.0 °2. Nausea and/or vomiting °3. Extreme swelling or bruising °4. Continued bleeding from incision. °5. Increased pain, redness, or drainage from the incision. °The clinic staff is available   to answer your questions during regular business hours.  Please don’t hesitate to call and ask to speak to one of the nurses for clinical concerns.  If  you have a medical emergency, go to the nearest emergency room or call 911.  A surgeon from [facility] is always on call at the hospital. °[address], [address], [place], [place]  [address] ? [address] [address], [place], [place]   [address] °[phone] ? [phone] ? FAX [phone] °Web site: [email or web address] ° °

## 2013-09-04 NOTE — Telephone Encounter (Signed)
N/A when trying to call pt but she still might be in the hospital so I will try again later. I want to give her the f/u appt with Dr Donne Hazel for 2/27 arrive at 10:15/10:30. I am leaving the appt a lone for 3/4 b/c he will prob. Want to see her back again after the appt on 2/27.

## 2013-09-04 NOTE — Evaluation (Signed)
Physical Therapy Evaluation Patient Details Name: [name] MRN: [id number] DOB: 11/09/1923 Today's Date: 09/04/2013 Time: 1761-6073 PT Time Calculation (min): 29 min  PT Assessment / Plan / Recommendation History of Present Illness  Patient is an 80 yo female s/p left total mastectomy.  Clinical Impression  Patient demonstrates deficits in functional mobility as indicated below will benefit from continued skilled PT to address deficits and maximize function. Will see as indicated and progress as tolerated. Recommend continued PT services upon discharge to Care center.    PT Assessment  Patient needs continued PT services    Follow Up Recommendations  Supervision/Assistance - 24 hour;Other (comment) (Continued PT at [facility] center)          Equipment Recommendations  None recommended by PT    Recommendations for Other Services OT consult   Frequency Min 3X/week    Precautions / Restrictions Precautions Precautions: Fall Restrictions Weight Bearing Restrictions: No Other Position/Activity Restrictions: painful with any LUE movement     Pertinent Vitals/Pain No pain at rest, 9/10 with any type of movement LUE.      Mobility  Bed Mobility Overal bed mobility: Needs Assistance Bed Mobility: Supine to Sit Supine to sit: Mod assist General bed mobility comments: Mod assist using chuck pad for support to come to EOB secondary to pain in LUE Transfers Overall transfer level: Needs assistance Equipment used: 1 person hand held assist Transfers: Sit to/from [facility] Sit to Stand: Min assist Stand pivot transfers: Min assist General transfer comment: assist for stability during standing Ambulation/Gait General Gait Details: not assessed today    Exercises     PT Diagnosis: Difficulty walking;Generalized weakness;Acute pain  PT Problem List: Decreased strength;Decreased range of motion;Decreased activity tolerance;Decreased balance;Decreased  mobility;Pain PT Treatment Interventions: DME instruction;Functional mobility training;Therapeutic activities;Therapeutic exercise;Balance training;Patient/family education     PT Goals(Current goals can be found in the care plan section) Acute Rehab PT Goals Patient Stated Goal: to not hurt so much PT Goal Formulation: With patient Time For Goal Achievement: 09/18/13 Potential to Achieve Goals: Good  Visit Information  Last PT Received On: 09/04/13 Assistance Needed: +1 History of Present Illness: Patient is an 80 yo female s/p left total mastectomy.       Prior Functioning  Home Living Family/patient expects to be discharged to:: Other (Comment) Acupuncturist at [employer]) Living Arrangements: Other (Comment) ([place] ) Prior Function Level of Independence: Independent with assistive device(s) Comments: uses cane primarily but also has RW and powerchair Dominant Hand: Right    Cognition  Cognition Arousal/Alertness: Awake/alert Behavior During Therapy: WFL for tasks assessed/performed Overall Cognitive Status: Within Functional Limits for tasks assessed    Extremity/Trunk Assessment Upper Extremity Assessment Upper Extremity Assessment:  (Extremely painful LUE) Lower Extremity Assessment Lower Extremity Assessment: Generalized weakness   Balance General Comments General comments (skin integrity, edema, etc.): Hygienje performed during session, stand pivot to commode and then to chair. Patient with some balance deficits requring UE assist secondary to increased pain  End of Session PT - End of Session Equipment Utilized During Treatment: Gait belt Activity Tolerance: Patient limited by pain Patient left: in chair;with call bell/phone within reach;with chair alarm set Nurse Communication: Mobility status  GP Functional Assessment Tool Used: clinical judgement Functional Limitation: Mobility: Walking and moving around Mobility: Walking and Moving Around Current Status  (X1062): At least 1 percent but less than 20 percent impaired, limited or restricted Mobility: Walking and Moving Around Goal Status [phone]): At least 20 percent but  less than 40 percent impaired, limited or restricted   Duncan Dull 09/04/2013, 8:50 AM Alben Deeds, PT DPT  [phone]

## 2013-09-04 NOTE — Progress Notes (Signed)
Pt. Did not want to eat her breakfast ordered.  Pt. Did eat an ensure pudding and drank some orange juice and coffee.  Complaining of soreness at incision site.  Will continue to monitor. Syliva Overman

## 2013-09-04 NOTE — Progress Notes (Signed)
[facility] called to inform MD of pt. Discharge plan. Pt. To be d/c tomorrow to SNF.  MD on call paged and informed that FL2 is in chart.  MD on call requested I write a sticky note for MD tomorrow to sign FL2.  Will continue to monitor. Syliva Overman

## 2013-09-04 NOTE — Progress Notes (Signed)
BSW student spoke with Vida Roller at [facility] regarding Pt d/c. Vida Roller states that Pt will not be able to come today, but Pt  well be able to come to [facility].  CSW will follow-up with d/c planning.  Elizabeth BSW-Intern [facility] [phone]

## 2013-09-05 ENCOUNTER — Encounter (HOSPITAL_COMMUNITY): Payer: Self-pay | Admitting: General Practice

## 2013-09-05 ENCOUNTER — Telehealth: Payer: Self-pay | Admitting: *Deleted

## 2013-09-05 DIAGNOSIS — C50212 Malignant neoplasm of upper-inner quadrant of left female breast: Secondary | ICD-10-CM

## 2013-09-05 NOTE — Telephone Encounter (Signed)
Called and spoke with Sam RN supervisor at [facility] to confirm appointment for patient for 09/11/13 at 3pm with Dr. Humphrey Rolls.  Faxed letter and intake form to [phone].  Took paperwork to medical records for chart. Instructions and contact information given.

## 2013-09-05 NOTE — Progress Notes (Signed)
Clinical Social Worker facilitated patient discharge by contacting the patient and facility, [facility]. Patient agreeable to this plan and arranging transport via EMS . CSW will sign off, as social work intervention is no longer needed.  Jeanette Caprice, MSW, [place]

## 2013-09-05 NOTE — Telephone Encounter (Signed)
Called to notify them the pt's f/u appt with Dr Donne Hazel is for 2/27 arrive at 10:15/10:30.

## 2013-09-05 NOTE — Discharge Summary (Signed)
Physician Discharge Summary  Patient ID: [name] MRN: [id number] DOB/AGE: 12-26-1923 80 y.o.  Admit date: 09/03/2013 Discharge date: 09/05/2013  Admission Diagnoses: Left breast cancer  Discharge Diagnoses:  Active Problems:   S/P mastectomy   Discharged Condition: good  Hospital Course: 5 yof with stage II left breast cancer who underwent left total mastectomy. Her pain is controlled, flaps viable, drain serous. She will be discharged to care center of her facilty  Consults: None  Significant Diagnostic Studies: none  Treatments: surgery: left total mastectomy  Discharge Exam: Blood pressure 156/63, pulse 76, temperature 98 F (36.7 C), temperature source Oral, resp. rate 16, height 4' 8.5" (1.435 m), weight 104 lb (47.174 kg), SpO2 94.00%. General: no distress CV: rrr Pulm: clear bilaterally Incision clean without infection, flap ecchymosis resolved flaps viable, drain serous  Disposition: 03-Skilled Nursing Facility   Future Appointments Provider Department Dept Phone   09/12/2013 10:30 AM Rolm Bookbinder, MD [facility], [place]   09/17/2013 1:30 PM Rolm Bookbinder, MD [facility], [place] [phone]       Medication List         alendronate 35 MG tablet  Commonly known as:  FOSAMAX  Take 35 mg by mouth every 7 (seven) days. On Tuesday     amLODipine 5 MG tablet  Commonly known as:  NORVASC  Take 5 mg by mouth daily.     aspirin 81 MG tablet  Take 81 mg by mouth daily.     brimonidine 0.2 % ophthalmic solution  Commonly known as:  ALPHAGAN  Place 1 drop into both eyes 2 (two) times daily.     levothyroxine 112 MCG tablet  Commonly known as:  SYNTHROID, LEVOTHROID  Take 112 mcg by mouth daily before breakfast.     mirtazapine 15 MG tablet  Commonly known as:  REMERON  Take 15 mg by mouth at bedtime.     omeprazole 20 MG capsule  Commonly known as:  PRILOSEC  Take 20 mg by mouth daily.     oxyCODONE-acetaminophen 10-325 MG per tablet  Commonly known as:  PERCOCET  Take 1 tablet by mouth every 6 (six) hours as needed for pain.     oxyCODONE-acetaminophen 5-325 MG per tablet  Commonly known as:  PERCOCET/ROXICET  Take 1 tablet by mouth every 6 (six) hours as needed for moderate pain.     OXYCONTIN 30 MG T12a  Generic drug:  OxyCODONE HCl ER  Take 30 mg by mouth every 12 (twelve) hours.     polyethylene glycol packet  Commonly known as:  MIRALAX / GLYCOLAX  Take 17 g by mouth daily.     rOPINIRole 0.5 MG tablet  Commonly known as:  REQUIP  Take 0.5 mg by mouth daily.     VITAMIN D-400 400 UNITS Tabs tablet  Generic drug:  cholecalciferol  Take 400 Units by mouth daily.           Follow-up Information   Follow up with [facility], MD In 1 week.   Specialty:  General Surgery   Contact information:   [address] [place] [address] [phone]       Signed: Rolm Bookbinder 09/05/2013, 10:07 AM

## 2013-09-05 NOTE — Progress Notes (Signed)
Clinical Social Work Department BRIEF PSYCHOSOCIAL ASSESSMENT 09/05/2013  Patient:  [name], [name]     Account Number:  [id number]     Admit date:  09/03/2013  Clinical Social Worker:  Megan Salon  Date/Time:  09/05/2013 11:04 AM  Referred by:  CSW  Date Referred:  09/05/2013 Referred for  SNF Placement   Other Referral:   Interview type:  Patient Other interview type:    PSYCHOSOCIAL DATA Living Status:  FACILITY Admitted from facility:  [facility] Level of care:  Independent Living Primary support name:  Theatre manager Primary support relationship to patient:  CHILD, ADULT Degree of support available:   Good    CURRENT CONCERNS Current Concerns  Post-Acute Placement   Other Concerns:    SOCIAL WORK ASSESSMENT / PLAN Per CSW, patient is from [facility] and has a bed at their SNF level for dc today. CSW went into room and introduced self to patient. Patient stated she is going to [facility] for a few weeks and then will go back to her independent living. Patient would like an EMS ride back to [place]. CSW will set this up. CSW sent over clinicals to [facility] and called Claiborne Billings at [place]. Claiborne Billings states they will take patient with pending insurance auth   Assessment/plan status:  Psychosocial Support/Ongoing Assessment of Needs Other assessment/ plan:   Information/referral to community resources:   SNF information    PATIENT'S/FAMILY'S RESPONSE TO PLAN OF CARE: Patient is agreeable to plan and looking forward to getting stronger       Jeanette Caprice, MSW, [place]

## 2013-09-05 NOTE — Progress Notes (Signed)
Pt. Discharged with EMS.  Pt. Discharged in stable condition.  All papers/FL2 sent with EMS. [name]

## 2013-09-08 ENCOUNTER — Telehealth: Payer: Self-pay | Admitting: *Deleted

## 2013-09-08 DIAGNOSIS — C50219 Malignant neoplasm of upper-inner quadrant of unspecified female breast: Secondary | ICD-10-CM

## 2013-09-08 NOTE — Telephone Encounter (Signed)
Spoke to Tour manager.  Confirmed new appt at 09/09/13 at 1130/1200.

## 2013-09-09 ENCOUNTER — Ambulatory Visit: Payer: Medicare Other | Admitting: Oncology

## 2013-09-09 ENCOUNTER — Encounter: Payer: Self-pay | Admitting: *Deleted

## 2013-09-09 ENCOUNTER — Other Ambulatory Visit: Payer: Medicare Other

## 2013-09-09 ENCOUNTER — Ambulatory Visit: Payer: Medicare Other

## 2013-09-09 NOTE — Progress Notes (Signed)
Completed chart, added to spreadsheet & placed chart on Dr. Laurelyn Sickle desk.

## 2013-09-11 ENCOUNTER — Ambulatory Visit: Payer: Medicare Other | Admitting: Oncology

## 2013-09-11 ENCOUNTER — Other Ambulatory Visit: Payer: Medicare Other

## 2013-09-11 ENCOUNTER — Telehealth: Payer: Self-pay | Admitting: *Deleted

## 2013-09-11 ENCOUNTER — Ambulatory Visit: Payer: Medicare Other

## 2013-09-11 NOTE — Telephone Encounter (Signed)
Called and spoke with pt's caregiver Raford Pitcher and confirmed 09/16/13 appt w/ her.  Updated chart and placed back in Dr. Laurelyn Sickle box.

## 2013-09-12 ENCOUNTER — Encounter (INDEPENDENT_AMBULATORY_CARE_PROVIDER_SITE_OTHER): Payer: Self-pay | Admitting: General Surgery

## 2013-09-12 ENCOUNTER — Ambulatory Visit (INDEPENDENT_AMBULATORY_CARE_PROVIDER_SITE_OTHER): Payer: Medicare Other | Admitting: General Surgery

## 2013-09-12 VITALS — BP 122/74 | HR 70 | Temp 98.2°F | Resp 18 | Ht 59.0 in | Wt 98.6 lb

## 2013-09-12 DIAGNOSIS — Z09 Encounter for follow-up examination after completed treatment for conditions other than malignant neoplasm: Secondary | ICD-10-CM

## 2013-09-12 NOTE — Progress Notes (Signed)
Subjective:     Patient ID: [name], female   DOB: [birth year], 80 y.o.   MRN: [id number]  HPI 80 year old female status post a left mastectomy for a 2.2 cm hormone receptor-positive invasive ductal carcinoma. She returns today doing well. She continues to have issues with her swallowing that pre-existent the surgery. She is otherwise doing well.  Review of Systems     Objective:   Physical Exam She has some ecchymosis on her superior flap that had been present since the surgery but these are viable. I do not have any of her drain measurements today and I will leave her drain in until next week. The drain output is serosanguineous in nature. There is no evidence of any infection.    Assessment:     Status post left mastectomy for stage II breast cancer     Plan:     She is doing well. I'm going to leave her drain in place today. She comes back for her next visit I need the list of her drain outputs. I'm going to give her some exercises that hopefully she can do in the care center. I've also asked for her to let Dr. Felipa Eth know about the swallowing issues.

## 2013-09-16 ENCOUNTER — Ambulatory Visit (HOSPITAL_BASED_OUTPATIENT_CLINIC_OR_DEPARTMENT_OTHER): Payer: Medicare Other | Admitting: Oncology

## 2013-09-16 ENCOUNTER — Encounter: Payer: Self-pay | Admitting: Oncology

## 2013-09-16 ENCOUNTER — Ambulatory Visit: Payer: Medicare Other

## 2013-09-16 ENCOUNTER — Other Ambulatory Visit (HOSPITAL_BASED_OUTPATIENT_CLINIC_OR_DEPARTMENT_OTHER): Payer: Medicare Other

## 2013-09-16 VITALS — BP 120/54 | HR 73 | Temp 98.1°F | Resp 18 | Ht <= 58 in | Wt 102.9 lb

## 2013-09-16 DIAGNOSIS — C50212 Malignant neoplasm of upper-inner quadrant of left female breast: Secondary | ICD-10-CM

## 2013-09-16 DIAGNOSIS — Z170 Estrogen receptor positive status [ER+]: Secondary | ICD-10-CM

## 2013-09-16 DIAGNOSIS — C50219 Malignant neoplasm of upper-inner quadrant of unspecified female breast: Secondary | ICD-10-CM

## 2013-09-16 LAB — COMPREHENSIVE METABOLIC PANEL (CC13)
ALK PHOS: 68 U/L (ref 40–150)
ALT: 8 U/L (ref 0–55)
AST: 12 U/L (ref 5–34)
Albumin: 3.3 g/dL — ABNORMAL LOW (ref 3.5–5.0)
Anion Gap: 5 mEq/L (ref 3–11)
BUN: 20.6 mg/dL (ref 7.0–26.0)
CALCIUM: 9.4 mg/dL (ref 8.4–10.4)
CHLORIDE: 106 meq/L (ref 98–109)
CO2: 30 mEq/L — ABNORMAL HIGH (ref 22–29)
Creatinine: 0.9 mg/dL (ref 0.6–1.1)
Glucose: 93 mg/dl (ref 70–140)
Potassium: 4.5 mEq/L (ref 3.5–5.1)
Sodium: 142 mEq/L (ref 136–145)
Total Bilirubin: <0.2 mg/dL (ref 0.20–1.20)
Total Protein: 6.2 g/dL — ABNORMAL LOW (ref 6.4–8.3)

## 2013-09-16 LAB — CBC WITH DIFFERENTIAL/PLATELET
BASO%: 0.7 % (ref 0.0–2.0)
BASOS ABS: 0.1 10*3/uL (ref 0.0–0.1)
EOS%: 8 % — AB (ref 0.0–7.0)
Eosinophils Absolute: 0.7 10*3/uL — ABNORMAL HIGH (ref 0.0–0.5)
HEMATOCRIT: 34.4 % — AB (ref 34.8–46.6)
HEMOGLOBIN: 11.2 g/dL — AB (ref 11.6–15.9)
LYMPH%: 24.9 % (ref 14.0–49.7)
MCH: 32.3 pg (ref 25.1–34.0)
MCHC: 32.5 g/dL (ref 31.5–36.0)
MCV: 99.4 fL (ref 79.5–101.0)
MONO#: 0.8 10*3/uL (ref 0.1–0.9)
MONO%: 9.2 % (ref 0.0–14.0)
NEUT#: 4.8 10*3/uL (ref 1.5–6.5)
NEUT%: 57.2 % (ref 38.4–76.8)
Platelets: 281 10*3/uL (ref 145–400)
RBC: 3.47 10*6/uL — ABNORMAL LOW (ref 3.70–5.45)
RDW: 13 % (ref 11.2–14.5)
WBC: 8.4 10*3/uL (ref 3.9–10.3)
lymph#: 2.1 10*3/uL (ref 0.9–3.3)

## 2013-09-16 MED ORDER — TAMOXIFEN CITRATE 20 MG PO TABS: 20.0000 mg | ORAL_TABLET | Freq: Every day | ORAL | Status: AC

## 2013-09-16 NOTE — Progress Notes (Signed)
Checked in new pt with no financial concerns. °

## 2013-09-16 NOTE — Patient Instructions (Addendum)
Smoking Cessation Quitting smoking is important to your health and has many advantages. However, it is not always easy to quit since nicotine is a very addictive drug. Often times, people try 3 times or more before being able to quit. This document explains the best ways for you to prepare to quit smoking. Quitting takes hard work and a lot of effort, but you can do it. ADVANTAGES OF QUITTING SMOKING  You will live longer, feel better, and live better.  Your body will feel the impact of quitting smoking almost immediately.  Within 20 minutes, blood pressure decreases. Your pulse returns to its normal level.  After 8 hours, carbon monoxide levels in the blood return to normal. Your oxygen level increases.  After 24 hours, the chance of having a heart attack starts to decrease. Your breath, hair, and body stop smelling like smoke.  After 48 hours, damaged nerve endings begin to recover. Your sense of taste and smell improve.  After 72 hours, the body is virtually free of nicotine. Your bronchial tubes relax and breathing becomes easier.  After 2 to 12 weeks, lungs can hold more air. Exercise becomes easier and circulation improves.  The risk of having a heart attack, stroke, cancer, or lung disease is greatly reduced.  After 1 year, the risk of coronary heart disease is cut in half.  After 5 years, the risk of stroke falls to the same as a nonsmoker.  After 10 years, the risk of lung cancer is cut in half and the risk of other cancers decreases significantly.  After 15 years, the risk of coronary heart disease drops, usually to the level of a nonsmoker.  If you are pregnant, quitting smoking will improve your chances of having a healthy baby.  The people you live with, especially any children, will be healthier.  You will have extra money to spend on things other than cigarettes. QUESTIONS TO THINK ABOUT BEFORE ATTEMPTING TO QUIT You may want to talk about your answers with your  caregiver.  Why do you want to quit?  If you tried to quit in the past, what helped and what did not?  What will be the most difficult situations for you after you quit? How will you plan to handle them?  Who can help you through the tough times? Your family? Friends? A caregiver?  What pleasures do you get from smoking? What ways can you still get pleasure if you quit? Here are some questions to ask your caregiver:  How can you help me to be successful at quitting?  What medicine do you think would be best for me and how should I take it?  What should I do if I need more help?  What is smoking withdrawal like? How can I get information on withdrawal? GET READY  Set a quit date.  Change your environment by getting rid of all cigarettes, ashtrays, matches, and lighters in your home, car, or work. Do not let people smoke in your home.  Review your past attempts to quit. Think about what worked and what did not. GET SUPPORT AND ENCOURAGEMENT You have a better chance of being successful if you have help. You can get support in many ways.  Tell your family, friends, and co-workers that you are going to quit and need their support. Ask them not to smoke around you.  Get individual, group, or telephone counseling and support. Programs are available at local hospitals and health centers. Call your local health department for   information about programs in your area.  Spiritual beliefs and practices may help some smokers quit.  Download a "quit meter" on your computer to keep track of quit statistics, such as how long you have gone without smoking, cigarettes not smoked, and money saved.  Get a self-help book about quitting smoking and staying off of tobacco. [place] yourself from urges to smoke. Talk to someone, go for a walk, or occupy your time with a task.  Change your normal routine. Take a different route to work. Drink tea instead of coffee.  Eat breakfast in a different place.  Reduce your stress. Take a hot bath, exercise, or read a book.  Plan something enjoyable to do every day. Reward yourself for not smoking.  Explore interactive web-based programs that specialize in helping you quit. GET MEDICINE AND USE IT CORRECTLY Medicines can help you stop smoking and decrease the urge to smoke. Combining medicine with the above behavioral methods and support can greatly increase your chances of successfully quitting smoking.  Nicotine replacement therapy helps deliver nicotine to your body without the negative effects and risks of smoking. Nicotine replacement therapy includes nicotine gum, lozenges, inhalers, nasal sprays, and skin patches. Some may be available over-the-counter and others require a prescription.  Antidepressant medicine helps people abstain from smoking, but how this works is unknown. This medicine is available by prescription.  Nicotinic receptor partial agonist medicine simulates the effect of nicotine in your brain. This medicine is available by prescription. Ask your caregiver for advice about which medicines to use and how to use them based on your health history. Your caregiver will tell you what side effects to look out for if you choose to be on a medicine or therapy. Carefully read the information on the package. Do not use any other product containing nicotine while using a nicotine replacement product.  RELAPSE OR DIFFICULT SITUATIONS Most relapses occur within the first 3 months after quitting. Do not be discouraged if you start smoking again. Remember, most people try several times before finally quitting. You may have symptoms of withdrawal because your body is used to nicotine. You may crave cigarettes, be irritable, feel very hungry, cough often, get headaches, or have difficulty concentrating. The withdrawal symptoms are only temporary. They are strongest when you first quit, but they will go away within  10 14 days. To reduce the chances of relapse, try to:  Avoid drinking alcohol. Drinking lowers your chances of successfully quitting.  Reduce the amount of caffeine you consume. Once you quit smoking, the amount of caffeine in your body increases and can give you symptoms, such as a rapid heartbeat, sweating, and anxiety.  Avoid smokers because they can make you want to smoke.  Do not let weight gain distract you. Many smokers will gain weight when they quit, usually less than 10 pounds. Eat a healthy diet and stay active. You can always lose the weight gained after you quit.  Find ways to improve your mood other than smoking. FOR MORE INFORMATION  www.smokefree.gov  Document Released: 06/27/2001 Document Revised: 01/02/2012 Document Reviewed: 10/12/2011 [facility] Patient Information 2014 [place], [place].   Tamoxifen oral tablet What is this medicine? TAMOXIFEN (ta MOX i fen) blocks the effects of estrogen. It is commonly used to treat breast cancer. It is also used to decrease the chance of breast cancer coming back in women who have received treatment for the disease. It may also help prevent breast cancer in women  who have a high risk of developing breast cancer. This medicine may be used for other purposes; ask your health care provider or pharmacist if you have questions. COMMON BRAND NAME(S): Nolvadex What should I tell my health care provider before I take this medicine? They need to know if you have any of these conditions: -blood clots -blood disease -cataracts or impaired eyesight -endometriosis -high calcium levels -high cholesterol -irregular menstrual cycles -liver disease -stroke -uterine fibroids -an unusual or allergic reaction to tamoxifen, other medicines, foods, dyes, or preservatives -pregnant or trying to get pregnant -breast-feeding How should I use this medicine? Take this medicine by mouth with a glass of water. Follow the directions on the prescription  label. You can take it with or without food. Take your medicine at regular intervals. Do not take your medicine more often than directed. Do not stop taking except on your doctor's advice. A special MedGuide will be given to you by the pharmacist with each prescription and refill. Be sure to read this information carefully each time. Talk to your pediatrician regarding the use of this medicine in children. While this drug may be prescribed for selected conditions, precautions do apply. Overdosage: If you think you have taken too much of this medicine contact a poison control center or emergency room at once. NOTE: This medicine is only for you. Do not share this medicine with others. What if I miss a dose? If you miss a dose, take it as soon as you can. If it is almost time for your next dose, take only that dose. Do not take double or extra doses. What may interact with this medicine? -aminoglutethimide -bromocriptine -chemotherapy drugs -female hormones, like estrogens and birth control pills -letrozole -medroxyprogesterone -phenobarbital -rifampin -warfarin This list may not describe all possible interactions. Give your health care provider a list of all the medicines, herbs, non-prescription drugs, or dietary supplements you use. Also tell them if you smoke, drink alcohol, or use illegal drugs. Some items may interact with your medicine. What should I watch for while using this medicine? Visit your doctor or health care professional for regular checks on your progress. You will need regular pelvic exams, breast exams, and mammograms. If you are taking this medicine to reduce your risk of getting breast cancer, you should know that this medicine does not prevent all types of breast cancer. If breast cancer or other problems occur, there is no guarantee that it will be found at an early stage. Do not become pregnant while taking this medicine or for 2 months after stopping this medicine. Stop  taking this medicine if you get pregnant or think you are pregnant and contact your doctor. This medicine may harm your unborn baby. Women who can possibly become pregnant should use birth control methods that do not use hormones during tamoxifen treatment and for 2 months after therapy has stopped. Talk with your health care provider for birth control advice. Do not breast feed while taking this medicine. What side effects may I notice from receiving this medicine? Side effects that you should report to your doctor or health care professional as soon as possible: -changes in vision (blurred vision) -changes in your menstrual cycle -difficulty breathing or shortness of breath -difficulty walking or talking -new breast lumps -numbness -pelvic pain or pressure -redness, blistering, peeling or loosening of the skin, including inside the mouth -skin rash or itching (hives) -sudden chest pain -swelling of lips, face, or tongue -swelling, pain or tenderness in your calf  or leg -unusual bruising or bleeding -vaginal discharge that is bloody, brown, or rust -weakness -yellowing of the whites of the eyes or skin Side effects that usually do not require medical attention (report to your doctor or health care professional if they continue or are bothersome): -fatigue -hair loss, although uncommon and is usually mild -headache -hot flashes -impotence (in men) -nausea, vomiting (mild) -vaginal discharge (white or clear) This list may not describe all possible side effects. Call your doctor for medical advice about side effects. You may report side effects to FDA at 1-800-FDA-1088. Where should I keep my medicine? Keep out of the reach of children. Store at room temperature between 20 and 25 degrees C (68 and 77 degrees F). Protect from light. Keep container tightly closed. Throw away any unused medicine after the expiration date. NOTE: This sheet is a summary. It may not cover all possible  information. If you have questions about this medicine, talk to your doctor, pharmacist, or health care provider.  2014, [employer]. (2008-03-19 12:01:56)

## 2013-09-16 NOTE — Progress Notes (Signed)
[name] [id number] 03/30/1924 80 y.o. 09/16/2013 10:02 AM  CC  Mathews Argyle, MD [address] [place] [address] Dr. Rolm Bookbinder  REASON FOR CONSULTATION:  80 year old female with diagnosis of T2 Nx (stage II) invasive ductal carcinoma of the left breast. Patient is seen in medical oncology for discussion of treatment options.  STAGE:   Breast cancer of upper-inner quadrant of left female breast   Primary site: Breast (Left)   Staging method: AJCC 7th Edition   Pathologic: Stage IIA (T2, NX, cM0) signed by Deatra Robinson, MD on 09/16/2013 10:56 AM   Summary: Stage IIA (T2, NX, cM0)   REFERRING PHYSICIAN: Dr. Donne Hazel  HISTORY OF PRESENT ILLNESS:  [name] is a 80 y.o. female.  With multiple medical problems including prior history of right-sided breast cancer. At that time patient underwent a lumpectomy followed by radiation therapy and then observation. She has no evidence of recurrent disease on the right side. Most recently in December 2014 patient felt a lump in the left breast. She went on to have workup performed including mammogram and ultrasound. The ultrasound and mammogram performed on 07/23/2013 showed a spiculated mass in the left upper inner quadrant measuring 1.9 cm. There were a right lumpectomy changes noted but no other abnormalities. Ultrasound showed an irregular mass at 10:30 o'clock position 3 cm from the nipple measuring 1.8 x 1.6 x 1.6 cm. 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 She was seen by Dr. Rolm Bookbinder and she has undergone a simple mastectomy of the left breast on 09/03/2013. With the final pathology revealing a 2.2 cm invasive ductal carcinoma with ductal carcinoma in situ, grade 3 with lymphovascular invasion. Postoperatively patient is doing well. She still has a JP drain in. She is going to  be seeing Dr. Donne Hazel soon to have this removed. She is currently living in [place] [facility] home and she is here with her caregiver from the home.   Past Medical History: Past Medical History  Diagnosis Date  . Cancer     breast  . Hypertension     takes Amlodipine daily  . Hypothyroidism     takes Synthroid daily  . GERD (gastroesophageal reflux disease)     takes Omeprazole daily  . Constipation     takes Miralax every other day  . Macular degeneration     dry  . Glaucoma     uses eye drops  . Complication of anesthesia     slow to wake up  . Dysphagia   . Arthritis   . Joint pain   . Joint swelling     knees  . Low back pain   . Osteoporosis     takes Fosamax weekly  . Diverticulosis   . Colitis   . Urinary frequency   . Nocturia   . Insomnia     takes Remeron nightly  . History of shingles   . Diverticulitis     Past Surgical History: Past Surgical History  Procedure Laterality Date  . Breast surgery  1995    rt breast ca  . Shoulder arthroscopy with labral repair      bilateral shoulder w/rod  . Hand surgery      bilateral hands  . Knee fusion    . Hip pinning    . Ear skin    . Back surgery    .  Cesarean section      x 3  . Colonoscopy    . Teeth extracted    . Neck surgery    . Cataracts Bilateral     removed  . Total mastectomy Left 09/02/2013    DR WAKEFIELD  . Simple mastectomy with axillary sentinel node biopsy Left 09/03/2013    Procedure:  MASTECTOMY;  Surgeon: Rolm Bookbinder, MD;  Location: [facility];  Service: General;  Laterality: Left;    Family History: History reviewed. No pertinent family history.  Social History History  Substance Use Topics  . Smoking status: Former Research scientist (life sciences)  . Smokeless tobacco: Never Used     Comment: quit 31yr ago  . Alcohol Use: No    Allergies: Allergies  Allergen Reactions  . Morphine And Related     HALLUCINATIONS    Current Medications: Current Outpatient Prescriptions  Medication Sig Dispense  Refill  . alendronate (FOSAMAX) 35 MG tablet Take 35 mg by mouth every 7 (seven) days. On Tuesday      . amLODipine (NORVASC) 5 MG tablet Take 5 mg by mouth daily.       .Marland Kitchenaspirin 81 MG tablet Take 81 mg by mouth daily.      . brimonidine (ALPHAGAN) 0.2 % ophthalmic solution Place 1 drop into both eyes 2 (two) times daily.       . cholecalciferol (VITAMIN D-400) 400 UNITS TABS tablet Take 400 Units by mouth daily.      .Marland Kitchenketoconazole (NIZORAL) 2 % cream       . levothyroxine (SYNTHROID, LEVOTHROID) 112 MCG tablet Take 112 mcg by mouth daily before breakfast.       . methocarbamol (ROBAXIN) 500 MG tablet       . mirtazapine (REMERON) 15 MG tablet Take 15 mg by mouth at bedtime.       .Marland Kitchenomeprazole (PRILOSEC) 20 MG capsule Take 20 mg by mouth daily.       .Marland KitchenoxyCODONE-acetaminophen (PERCOCET) 10-325 MG per tablet Take 1 tablet by mouth every 6 (six) hours as needed for pain.       .Marland KitchenoxyCODONE-acetaminophen (PERCOCET/ROXICET) 5-325 MG per tablet Take 1 tablet by mouth every 6 (six) hours as needed for moderate pain.  30 tablet  0  . OXYCONTIN 30 MG T12A Take 30 mg by mouth every 12 (twelve) hours.       . polyethylene glycol (MIRALAX / GLYCOLAX) packet Take 17 g by mouth daily.      .Marland KitchenrOPINIRole (REQUIP) 0.5 MG tablet Take 0.5 mg by mouth daily.        No current facility-administered medications for this visit.    OB/GYN History: menarche at 89 menopause at late 89's no HRT, [id number] age at first live birthlate 20's  Fertility Discussion: no Prior History of Cancer: cancer of right breast s/p lumpectomy 10 years ago, radiation therapy, no chemotherapy or anti-estrogen  Health Maintenance:  Colonoscopy yes Bone Density unknown Last PAP smear unknown  ECOG PERFORMANCE STATUS: 2 - Symptomatic, <50% confined to bed  Genetic Counseling/testing: yes, refer to genetic counseling  REVIEW OF SYSTEMS:  A comprehensive 14 point review of systems was obtained and it is scanned separately into the  electronic medical record  PHYSICAL EXAMINATION: Blood pressure 120/54, pulse 73, temperature 98.1 F (36.7 C), temperature source Oral, resp. rate 18, height _0  (1.422 m), weight 102 lb 14.4 oz (46.675 kg).  General:  well-nourished in no acute distress.  Eyes:  no scleral icterus.  ENT:  There were no oropharyngeal lesions.  Neck was without thyromegaly.  Lymphatics:  Negative cervical, supraclavicular or axillary adenopathy.  Respiratory: lungs were clear bilaterally without wheezing or crackles.  Cardiovascular:  Regular rate and rhythm, S1/S2, without murmur, rub or gallop.  There was no pedal edema.  GI:  abdomen was soft, flat, nontender, nondistended, without organomegaly.  Muscoloskeletal:  no spinal tenderness of palpation of vertebral spine.  Skin exam was without echymosis, petichae.  Neuro exam was nonfocal.  Patient was able to get on and off exam table without assistance.  Gait was normal.  Patient was alerted and oriented.  Attention was good.   Language was appropriate.  Mood was normal without depression.  Speech was not pressured.  Thought content was not tangential.   Breasts: right breast normal without mass, skin or nipple changes or axillary nodes, post-mastectomy site well healed and free of suspicious changes.   STUDIES/RESULTS: Dg Chest 2 View  09/03/2013   CLINICAL DATA:  Preoperative evaluation for left mastectomy, history left breast cancer, hypertension, former smoker, GERD  EXAM: CHEST  2 VIEW  COMPARISON:  03/28/2011  FINDINGS: Upper normal heart size.  Densely calcified mildly tortuous thoracic aorta.  Mediastinal contours and pulmonary vascularity normal.  Emphysematous minimal bronchitic changes consistent with COPD.  Eventration anterior right diaphragm again noted.  No acute infiltrate, pleural effusion or pneumothorax.  Bilateral shoulder prostheses.  Surgical clips right breast.  Diffuse osseous demineralization.  IMPRESSION: COPD changes.  No acute  abnormalities.   Electronically Signed   By: Lavonia Dana M.D.   On: 09/03/2013 12:22     LABS:    Chemistry      Component Value Date/Time   NA 144 09/03/2013 1106   K 4.5 09/03/2013 1106   CL 106 09/03/2013 1106   CO2 24 09/03/2013 1106   BUN 18 09/03/2013 1106   CREATININE 0.95 09/03/2013 1106      Component Value Date/Time   CALCIUM 9.4 09/03/2013 1106   ALKPHOS 94 09/03/2013 1106   AST 20 09/03/2013 1106   ALT 8 09/03/2013 1106   BILITOT <0.2* 09/03/2013 1106      Lab Results  Component Value Date   WBC 8.4 09/16/2013   HGB 11.2* 09/16/2013   HCT 34.4* 09/16/2013   MCV 99.4 09/16/2013   PLT 281 09/16/2013     PATHOLOGY 09/03/13 FINAL DIAGNOSIS Diagnosis Breast, simple mastectomy, Left - INVASIVE DUCTAL CARCINOMA, GRADE 3 OF 3, SPANNING 2.2 CM. - DUCTAL CARCINOMA IN SITU. - LYMPHOVASCULAR INVASION IDENTIFIED. - RESECTION MARGINS ARE NEGATIVE. - SEE ONCOLOGY TABLE. Microscopic Comment BREAST, INVASIVE TUMOR, WITHOUT LYMPH NODE SAMPLING Specimen, including laterality and lymph node sampling (sentinel, non-sentinel): Left breast Procedure: Left simple mastectomy. Histologic type: Invasive ductal carcinoma. Grade: 3 Tubule formation: 3 Nuclear pleomorphism: 3 Mitotic: 2 Tumor size (gross measurement): 2.2 cm Margins: Negative. Invasive, distance to closest margin: 1.5 cm to posterior In-situ, distance to closest margin: 1.5 cm to posterior Lymphovascular invasion: Present. Ductal carcinoma in situ: Present. Grade: III Extensive intraductal component: No. Lobular neoplasia: Absent. Tumor focality: Unifocal. Treatment effect: N/A. Extent of tumor: Confined to breast parenchyma. Breast prognostic profile: Performed on [id number], will not be repeated. Estrogen receptor: Positive, strong staining intensity. Progesterone receptor: Positive, strong staining intensity. Her 2 neu: No amplification detected. Ki-67: 20% 1 of 2 FINAL for JENNISE, BOTH ([id number]) Microscopic  Comment(continued) Non-neoplastic breast: Fibrocystic change with calcifications, biopsy site change. TNM: pT2, pNX Comments: There are microcalcifications within carcinoma and benign breast  tissue. Vicente Males MD Pathologist, Electronic Signature (Case signed 09/05/2013)   ASSESSMENT/PLAN    80 year old female with  #1 stage II (T2 Nx) invasive ductal carcinoma of the left breast diagnosed January 2015 after patient presented with a palpable left breast mass. Patient has undergone a simple mastectomy with the final pathology revealing 2.2 cm grade 3 invasive ductal carcinoma, with associated ductal carcinoma in situ. There was noted to be lymphovascular invasion. Tumor is ER positive and PR positive with a proliferation marker Ki-67 20% and HER-2/neu showed no amplification. Postoperatively patient is doing well.  #2 prior diagnosis of right-sided breast cancer treated by Dr. Neldon Mc with a lumpectomy followed by radiation therapy. Patient did not undergo any kind of adjuvant chemotherapy or antiestrogen therapy.  #3.  We spent the better part of today's 6mnute appointment discussing the biology of breast cancer in general, and the specifics of the patient's tumor in particular. We discussed the pathology and radiology in detail today. We also discussed the pathophysiology of patient's breast cancer. We discussed the prognostic markers. We discussed the multidisciplinary approach to treatment of breast cancer including surgery radiation oncology and medical oncology. We discussed the patient's tumor being estrogen receptor positive and progesterone receptor positive so therefore she would be eligible for antiestrogen therapy with tamoxifen or one of the aromatase inhibitors. We discussed risks and benefits and side effects of these agents. We discussed role of chemotherapy in the treatment of breast cancer. However due to the patient's age and multiple comorbidities I do not think she  would benefit significantly from chemotherapy. She in fact does not want chemotherapy. I discussed tamoxifen therapy specifically with the patient. Since I do think that she would not be able to tolerate an aromatase inhibitor do to osteoporosis and myalgias and arthralgias. He discussed side effects of tamoxifen. We discussed duration of therapy anywhere from 5-10 years. A prescription for tamoxifen was given to the patient in writing so that she can take it to the nursing home and have it filled.  #3 we discussed genetics of breast cancer including testing her for the BRCA1 and BRCA2 gene mutation given to her family history of breast cancer. She is very much interested in this. I will get the patient set up to see our genetic counselor KRoma Kayserin the next few months.  #4 patient will be followed up in 3 months time with blood work and evaluation of toxicity to tamoxifen.    Clinical Trial Eligibility: no   Discussion: Patient is being treated per NCCN breast cancer care guidelines appropriate for stage.II  Thank you so much for allowing me to participate in the care of LKeiona JenisonWard. I will continue to follow up the patient with you and assist in her care.  All questions were answered. The patient knows to call the clinic with any problems, questions or concerns. We can certainly see the patient much sooner if necessary.  I spent 40 minutes counseling the patient face to face. The total time spent in the appointment was 55 minutes.  KMarcy Panning MD Medical/Oncology [facility]3408 [phone](beeper) [phone](Office)  09/16/2013, 10:02 AM

## 2013-09-17 ENCOUNTER — Ambulatory Visit (INDEPENDENT_AMBULATORY_CARE_PROVIDER_SITE_OTHER): Payer: Medicare Other | Admitting: General Surgery

## 2013-09-17 ENCOUNTER — Encounter (INDEPENDENT_AMBULATORY_CARE_PROVIDER_SITE_OTHER): Payer: Self-pay | Admitting: General Surgery

## 2013-09-17 VITALS — BP 110/60 | HR 72 | Resp 16

## 2013-09-17 DIAGNOSIS — Z9010 Acquired absence of unspecified breast and nipple: Secondary | ICD-10-CM

## 2013-09-17 NOTE — Progress Notes (Signed)
Subjective:     Patient ID: [name], female   DOB: [birth year], 80 y.o.   MRN: [id number]  HPI 80 year old female who had a prior right breast cancer who has now undergone a left total mastectomy. We discussed her pathology at her previous visit. She has been seen by medical oncology and will begin anti-estrogen therapy. She is doing well he returns today for followup without complaints. She is still in the care center at [facility] facility. Her drain is putting out much less and she comes in for removal today.  Review of Systems     Objective:   Physical Exam Healing left mastectomy incision with some Steri-Strips still in place, no infection, no seroma, drain with minimal output    Assessment:     Status post left mastectomy     Plan:     I removed her drain today. She may begin to shower or take a bath now. She may return to full normal activity. I will provide her with a list of warm exercise it would be helpful also. I will see her back in 6 months or sooner if needed. After 24 hours she may go back to wearing a regular bra She should keep her drain site covered until it no longer has any leakage.

## 2013-09-19 ENCOUNTER — Telehealth: Payer: Self-pay | Admitting: Oncology

## 2013-09-19 NOTE — Telephone Encounter (Signed)
, °

## 2013-10-05 NOTE — Progress Notes (Signed)
I agree with the above documentation.    [facility]  4N [id number];  [id number] Phone: [phone]

## 2013-10-29 ENCOUNTER — Ambulatory Visit (INDEPENDENT_AMBULATORY_CARE_PROVIDER_SITE_OTHER): Payer: Medicare Other

## 2013-10-29 ENCOUNTER — Encounter: Payer: Self-pay | Admitting: Podiatry

## 2013-10-29 ENCOUNTER — Ambulatory Visit (INDEPENDENT_AMBULATORY_CARE_PROVIDER_SITE_OTHER): Payer: Medicare Other | Admitting: Podiatry

## 2013-10-29 DIAGNOSIS — R52 Pain, unspecified: Secondary | ICD-10-CM

## 2013-10-29 DIAGNOSIS — M7750 Other enthesopathy of unspecified foot: Secondary | ICD-10-CM

## 2013-10-29 DIAGNOSIS — M722 Plantar fascial fibromatosis: Secondary | ICD-10-CM

## 2013-10-29 MED ORDER — TRIAMCINOLONE ACETONIDE 10 MG/ML IJ SUSP
10.0000 mg | Freq: Once | INTRAMUSCULAR | Status: AC
Start: 2013-10-29 — End: 2013-10-29
  Administered 2013-10-29: 10 mg

## 2013-10-29 NOTE — Progress Notes (Signed)
   Subjective:    Patient ID: [name], female    DOB: 1924/04/08, 80 y.o.   MRN: [id number]  HPI PT STATED RT FOOT INSIDE THE ARCH IS HURTING. THE FOOT IS GETTING WORSE. THE FOOT GET AGGRAVATED  BY WALKING AND TRIED NO TREATMENT.    Review of Systems  Musculoskeletal: Positive for gait problem.  All other systems reviewed and are negative.      Objective:   Physical Exam        Assessment & Plan:

## 2013-10-29 NOTE — Progress Notes (Signed)
Subjective:     Patient ID: [name], female   DOB: 30-Dec-1923, 80 y.o.   MRN: [id number]  Foot Pain   patient states the inside of my right arch has been sore and making it hard for me to walk at times and my toes seem to be out of alignment and my nails. Patient presents with caregiver stating it's been hurting her for a short period of time   Review of Systems  All other systems reviewed and are negative.      Objective:   Physical Exam  Nursing note and vitals reviewed. Constitutional: She is oriented to person, place, and time.  Musculoskeletal: Normal range of motion.  Neurological: She is oriented to person, place, and time.  Skin: Skin is dry.   diminishment of circulatory status both DP and PT pulses right over left with diminished range of motion subtalar midtarsal joint and muscle strength noted bilateral. Right arch is painful and inflamed around posterior tibial insertion and a noted nail disease of the lesser toes right with digital deformity of both feet     Assessment:     Patient does have age-appropriate digital deformities and nail disease and is found to have tendinitis right    Plan:     H&P and x-ray reviewed and careful injection right posterior tib at its insertion 3 mg dexamethasone Kenalog 5 mg Xylocaine and applied fascially brace to lift the arch. Reappoint if symptoms persist

## 2013-12-02 ENCOUNTER — Telehealth: Payer: Self-pay | Admitting: Oncology

## 2013-12-02 NOTE — Telephone Encounter (Signed)
, °

## 2013-12-23 ENCOUNTER — Ambulatory Visit (HOSPITAL_BASED_OUTPATIENT_CLINIC_OR_DEPARTMENT_OTHER): Payer: Medicare Other

## 2013-12-23 ENCOUNTER — Ambulatory Visit (HOSPITAL_BASED_OUTPATIENT_CLINIC_OR_DEPARTMENT_OTHER): Payer: Medicare Other | Admitting: Adult Health

## 2013-12-23 ENCOUNTER — Encounter: Payer: Self-pay | Admitting: Adult Health

## 2013-12-23 ENCOUNTER — Other Ambulatory Visit: Payer: Medicare Other

## 2013-12-23 ENCOUNTER — Ambulatory Visit: Payer: Medicare Other | Admitting: Oncology

## 2013-12-23 VITALS — BP 177/62 | HR 85 | Temp 97.9°F | Resp 18 | Ht <= 58 in | Wt 104.2 lb

## 2013-12-23 DIAGNOSIS — C50219 Malignant neoplasm of upper-inner quadrant of unspecified female breast: Secondary | ICD-10-CM

## 2013-12-23 DIAGNOSIS — C50212 Malignant neoplasm of upper-inner quadrant of left female breast: Secondary | ICD-10-CM

## 2013-12-23 DIAGNOSIS — Z170 Estrogen receptor positive status [ER+]: Secondary | ICD-10-CM

## 2013-12-23 DIAGNOSIS — C779 Secondary and unspecified malignant neoplasm of lymph node, unspecified: Secondary | ICD-10-CM

## 2013-12-23 LAB — COMPREHENSIVE METABOLIC PANEL (CC13)
ALT: 10 U/L (ref 0–55)
AST: 18 U/L (ref 5–34)
Albumin: 3.5 g/dL (ref 3.5–5.0)
Alkaline Phosphatase: 58 U/L (ref 40–150)
Anion Gap: 8 mEq/L (ref 3–11)
BUN: 19.7 mg/dL (ref 7.0–26.0)
CALCIUM: 9.2 mg/dL (ref 8.4–10.4)
CO2: 27 mEq/L (ref 22–29)
Chloride: 108 mEq/L (ref 98–109)
Creatinine: 1.1 mg/dL (ref 0.6–1.1)
Glucose: 112 mg/dl (ref 70–140)
Potassium: 4.1 mEq/L (ref 3.5–5.1)
SODIUM: 143 meq/L (ref 136–145)
TOTAL PROTEIN: 7.2 g/dL (ref 6.4–8.3)
Total Bilirubin: 0.25 mg/dL (ref 0.20–1.20)

## 2013-12-23 LAB — CBC WITH DIFFERENTIAL/PLATELET
BASO%: 0.5 % (ref 0.0–2.0)
Basophils Absolute: 0 10*3/uL (ref 0.0–0.1)
EOS%: 1.4 % (ref 0.0–7.0)
Eosinophils Absolute: 0.1 10*3/uL (ref 0.0–0.5)
HCT: 37.4 % (ref 34.8–46.6)
HGB: 12.1 g/dL (ref 11.6–15.9)
LYMPH%: 16.7 % (ref 14.0–49.7)
MCH: 30.9 pg (ref 25.1–34.0)
MCHC: 32.3 g/dL (ref 31.5–36.0)
MCV: 95.5 fL (ref 79.5–101.0)
MONO#: 0.3 10*3/uL (ref 0.1–0.9)
MONO%: 5.5 % (ref 0.0–14.0)
NEUT%: 75.9 % (ref 38.4–76.8)
NEUTROS ABS: 4.7 10*3/uL (ref 1.5–6.5)
PLATELETS: 196 10*3/uL (ref 145–400)
RBC: 3.91 10*6/uL (ref 3.70–5.45)
RDW: 15.3 % — ABNORMAL HIGH (ref 11.2–14.5)
WBC: 6.2 10*3/uL (ref 3.9–10.3)
lymph#: 1 10*3/uL (ref 0.9–3.3)

## 2013-12-23 NOTE — Patient Instructions (Signed)
If the skin lesion at your left mastectomy site does not improve, please f/u with Dr. Donne Hazel.    Continue Tamoxifen daily.

## 2013-12-23 NOTE — Progress Notes (Signed)
ID: Ula Lingo Hughlett OB: Feb 08, 1924  MR#: [id number]  BTD#:[id number]  PCP: Mathews Argyle, MD GYN:   SU: Dr. Donne Hazel OTHER MD:  CHIEF COMPLAINT: 80 year old [place], [place] woman here for follow up of her breast cancer.  BREAST CANCER HISTORY: [name] is a 80 y.o woman with multiple medical problems including prior history of right-sided breast cancer. At that time patient underwent a lumpectomy followed by radiation therapy and then observation. She has no evidence of recurrent disease on the right side. Most recently in December 2014 patient felt a lump in the left breast. She went on to have workup performed including mammogram and ultrasound. The ultrasound and mammogram performed on 07/23/2013 showed a spiculated mass in the left upper inner quadrant measuring 1.9 cm. There were a right lumpectomy changes noted but no other abnormalities. Ultrasound showed an irregular mass at 10:30 o'clock position 3 cm from the nipple measuring 1.8 x 1.6 x 1.6 cm. 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   CURRENT THERAPY: Tamoxifen 25m daily  INTERVAL HISTORY: Patient is here today and is concerned about a spot on her left mastectomy site that is red and painful that she wants me to look at.  She is applying ketoconazole cream to it.  She is taking Tamoxifen daily and is tolerating it moderately well.  She does have significant arthritis.  She denies new pain, fevers, chills, night sweats, or any further concerns.   REVIEW OF SYSTEMS: A 10 point review of systems was conducted and is otherwise negative except for what is noted above.     PAST MEDICAL HISTORY: Past Medical History  Diagnosis Date  . Cancer     breast  . Hypertension     takes Amlodipine daily  . Hypothyroidism     takes Synthroid daily  . GERD (gastroesophageal reflux disease)     takes Omeprazole daily   . Constipation     takes Miralax every other day  . Macular degeneration     dry  . Glaucoma     uses eye drops  . Complication of anesthesia     slow to wake up  . Dysphagia   . Arthritis   . Joint pain   . Joint swelling     knees  . Low back pain   . Osteoporosis     takes Fosamax weekly  . Diverticulosis   . Colitis   . Urinary frequency   . Nocturia   . Insomnia     takes Remeron nightly  . History of shingles   . Diverticulitis     PAST SURGICAL HISTORY: Past Surgical History  Procedure Laterality Date  . Breast surgery  1995    rt breast ca  . Shoulder arthroscopy with labral repair      bilateral shoulder w/rod  . Hand surgery      bilateral hands  . Knee fusion    . Hip pinning    . Ear skin    . Back surgery    . Cesarean section      x 3  . Colonoscopy    . Teeth extracted    . Neck surgery    . Cataracts Bilateral     removed  . Total mastectomy Left 09/02/2013    DR WAKEFIELD  . Simple mastectomy with axillary sentinel node biopsy Left 09/03/2013    Procedure:  MASTECTOMY;  Surgeon: Rolm Bookbinder, MD;  Location: [facility];  Service: General;  Laterality: Left;    FAMILY HISTORY No family history on file.  GYNECOLOGIC HISTORY:   SOCIAL HISTORY: Lives at [facility] assisted [facility]. She is with Lanelle Bal her sitter for today.      ADVANCED DIRECTIVES: In place at her assisted living facility   HEALTH MAINTENANCE: History  Substance Use Topics  . Smoking status: Former Research scientist (life sciences)  . Smokeless tobacco: Never Used     Comment: quit 39yr ago  . Alcohol Use: No      Mammogram: 07/23/2013 Colonoscopy: 2012 Bone Density Scan: unsure Pap Smear:  n/a Eye Exam: 11/2013    Allergies  Allergen Reactions  . Morphine And Related     HALLUCINATIONS    Current Outpatient Prescriptions  Medication Sig Dispense Refill  . alendronate (FOSAMAX) 35 MG tablet Take 35 mg by mouth every 7 (seven) days. On Tuesday      . amLODipine (NORVASC) 5 MG  tablet Take 5 mg by mouth daily.       .Marland Kitchenaspirin 81 MG tablet Take 81 mg by mouth daily.      . brimonidine (ALPHAGAN) 0.2 % ophthalmic solution Place 1 drop into both eyes 2 (two) times daily.       . cholecalciferol (VITAMIN D-400) 400 UNITS TABS tablet Take 400 Units by mouth daily.      .Marland Kitchenketoconazole (NIZORAL) 2 % cream       . levothyroxine (SYNTHROID, LEVOTHROID) 112 MCG tablet Take 112 mcg by mouth daily before breakfast.       . methocarbamol (ROBAXIN) 500 MG tablet       . mirtazapine (REMERON) 15 MG tablet Take 15 mg by mouth at bedtime.       .Marland Kitchenomeprazole (PRILOSEC) 20 MG capsule Take 20 mg by mouth daily.       .Marland KitchenoxyCODONE-acetaminophen (PERCOCET) 10-325 MG per tablet Take 1 tablet by mouth every 6 (six) hours as needed for pain.       . OXYCONTIN 30 MG T12A Take 30 mg by mouth every 12 (twelve) hours.       . polyethylene glycol (MIRALAX / GLYCOLAX) packet Take 17 g by mouth daily.      .Marland KitchenrOPINIRole (REQUIP) 0.5 MG tablet Take 0.5 mg by mouth daily.       . tamoxifen (NOLVADEX) 20 MG tablet        No current facility-administered medications for this visit.    OBJECTIVE: Filed Vitals:   12/23/13 1037  BP: 177/62  Pulse: 85  Temp: 97.9 F (36.6 C)  Resp: 18     Body mass index is 23.37 kg/(m^2).     GENERAL: Patient is a well appearing female in no acute distress HEENT:  Sclerae anicteric.  Oropharynx clear and moist. No ulcerations or evidence of oropharyngeal candidiasis. Neck is supple.  NODES:  No cervical, supraclavicular, or axillary lymphadenopathy palpated.  BREAST EXAM:  Left mastectomy site without nodularity, red scaling patch on lateral side of mastectomy scar, non-tender, non-warm.  Right breast no nodularity or skin changes, she does have a yeast like appearing rash under right breast LUNGS:  Clear to auscultation bilaterally.  No wheezes or rhonchi. HEART:  Regular rate and rhythm. No murmur appreciated. ABDOMEN:  Soft, nontender.  Positive, normoactive  bowel sounds. No organomegaly palpated. MSK:  No focal spinal tenderness to palpation. Full range of motion bilaterally in the upper extremities. EXTREMITIES:  No peripheral edema.  SKIN:  Clear with no obvious rashes or skin changes. No nail dyscrasia. NEURO:  Nonfocal. Well oriented.  Appropriate affect. ECOG FS:1 - Symptomatic but completely ambulatory  LAB RESULTS:  CMP     Component Value Date/Time   NA 143 12/23/2013 1020   NA 144 09/03/2013 1106   K 4.1 12/23/2013 1020   K 4.5 09/03/2013 1106   CL 106 09/03/2013 1106   CO2 27 12/23/2013 1020   CO2 24 09/03/2013 1106   GLUCOSE 112 12/23/2013 1020   GLUCOSE 100* 09/03/2013 1106   BUN 19.7 12/23/2013 1020   BUN 18 09/03/2013 1106   CREATININE 1.1 12/23/2013 1020   CREATININE 0.95 09/03/2013 1106   CALCIUM 9.2 12/23/2013 1020   CALCIUM 9.4 09/03/2013 1106   PROT 7.2 12/23/2013 1020   PROT 7.1 09/03/2013 1106   ALBUMIN 3.5 12/23/2013 1020   ALBUMIN 3.7 09/03/2013 1106   AST 18 12/23/2013 1020   AST 20 09/03/2013 1106   ALT 10 12/23/2013 1020   ALT 8 09/03/2013 1106   ALKPHOS 58 12/23/2013 1020   ALKPHOS 94 09/03/2013 1106   BILITOT 0.25 12/23/2013 1020   BILITOT <0.2* 09/03/2013 1106   GFRNONAA 52* 09/03/2013 1106   GFRAA 61* 09/03/2013 1106    I No results found for this basename: SPEP,  UPEP,   kappa and lambda light chains    Lab Results  Component Value Date   WBC 6.2 12/23/2013   NEUTROABS 4.7 12/23/2013   HGB 12.1 12/23/2013   HCT 37.4 12/23/2013   MCV 95.5 12/23/2013   PLT 196 12/23/2013      Chemistry      Component Value Date/Time   NA 143 12/23/2013 1020   NA 144 09/03/2013 1106   K 4.1 12/23/2013 1020   K 4.5 09/03/2013 1106   CL 106 09/03/2013 1106   CO2 27 12/23/2013 1020   CO2 24 09/03/2013 1106   BUN 19.7 12/23/2013 1020   BUN 18 09/03/2013 1106   CREATININE 1.1 12/23/2013 1020   CREATININE 0.95 09/03/2013 1106      Component Value Date/Time   CALCIUM 9.2 12/23/2013 1020   CALCIUM 9.4 09/03/2013 1106   ALKPHOS 58 12/23/2013 1020   ALKPHOS 94  09/03/2013 1106   AST 18 12/23/2013 1020   AST 20 09/03/2013 1106   ALT 10 12/23/2013 1020   ALT 8 09/03/2013 1106   BILITOT 0.25 12/23/2013 1020   BILITOT <0.2* 09/03/2013 1106       Lab Results  Component Value Date   LABCA2 20 09/03/2013    No components found with this basename: [id number]    No results found for this basename: INR,  in the last 168 hours  Urinalysis    Component Value Date/Time   COLORURINE YELLOW 03/28/2011 0024   APPEARANCEUR CLEAR 03/28/2011 0024   LABSPEC 1.014 03/28/2011 0024   PHURINE 5.5 03/28/2011 0024   GLUCOSEU NEGATIVE 03/28/2011 0024   HGBUR NEGATIVE 03/28/2011 0024   BILIRUBINUR NEGATIVE 03/28/2011 0024   KETONESUR NEGATIVE 03/28/2011 0024   PROTEINUR NEGATIVE 03/28/2011 0024   UROBILINOGEN 0.2 03/28/2011 0024   NITRITE NEGATIVE 03/28/2011 0024   LEUKOCYTESUR NEGATIVE 03/28/2011 0024    STUDIES: No results found.  ASSESSMENT: 80 y.o.  [place] woman with T2, Nx, stage II, invasive ductal carcinoma, grade III, ER positive, PR positive, Ki-67 20%, HER-2/neu negative.  .   1.  She saw Dr. Rolm Bookbinder and she has undergone a simple mastectomy of the left breast on  09/03/2013. With the final pathology revealing a 2.2 cm invasive ductal carcinoma with ductal carcinoma in situ, grade 3 with lymphovascular invasion.   2.  Patient was started on Tamoxifen daily on 09/16/13.     PLAN:  Ms. Denio is doing relatively well today.  She is taking Tamoxifen daily and seems to be tolerating it relatively well.  She does not notice a correlation with an increase of her joint pain when she started the tamoxifen.  She Will continue this.    We reviewed her health maintenance above, and I encouraged her to try to get the results of her recent bone density and fax them to [place].  I recommended healthy diet and monthly self breast exams.    I recommended she continue the Ketoconazole cream underneath her breast and to the left mastectomy lesion in addition to Neosporin.  I  did recommend that if it does worsen, or become swollen or warm, to f/u with Dr. Donne Hazel.    The patient will return in 6 months for labs and evaluation by Dr. Lindi Adie.   She knows to call us in the interim for any questions or concerns.  We can certainly see her sooner if needed.  I spent 25 minutes counseling the patient face to face.  The total time spent in the appointment was 30 minutes.  Minette Headland, [place] [phone]    12/25/2013 7:49 AM

## 2013-12-24 ENCOUNTER — Telehealth: Payer: Self-pay | Admitting: Oncology

## 2013-12-24 NOTE — Telephone Encounter (Signed)
s.w. pt and advsied on Dec appt...pt ok adn aware.Marland KitchenMarland KitchenMarland Kitchen

## 2014-03-27 ENCOUNTER — Ambulatory Visit (INDEPENDENT_AMBULATORY_CARE_PROVIDER_SITE_OTHER): Payer: Medicare Other | Admitting: General Surgery

## 2014-05-19 ENCOUNTER — Other Ambulatory Visit: Payer: Medicare Other

## 2014-05-26 ENCOUNTER — Ambulatory Visit: Payer: Medicare Other | Admitting: Hematology and Oncology

## 2014-06-17 ENCOUNTER — Other Ambulatory Visit: Payer: Self-pay

## 2014-06-17 DIAGNOSIS — C50212 Malignant neoplasm of upper-inner quadrant of left female breast: Secondary | ICD-10-CM

## 2014-06-18 ENCOUNTER — Other Ambulatory Visit: Payer: Medicare Other

## 2014-06-25 ENCOUNTER — Telehealth: Payer: Self-pay | Admitting: Hematology and Oncology

## 2014-06-25 ENCOUNTER — Ambulatory Visit (HOSPITAL_BASED_OUTPATIENT_CLINIC_OR_DEPARTMENT_OTHER): Payer: Medicare Other | Admitting: Hematology and Oncology

## 2014-06-25 VITALS — BP 130/61 | HR 70 | Temp 98.4°F | Resp 18 | Ht <= 58 in | Wt 112.0 lb

## 2014-06-25 DIAGNOSIS — C50212 Malignant neoplasm of upper-inner quadrant of left female breast: Secondary | ICD-10-CM

## 2014-06-25 DIAGNOSIS — Z853 Personal history of malignant neoplasm of breast: Secondary | ICD-10-CM

## 2014-06-25 NOTE — Assessment & Plan Note (Signed)
Bilateral breast cancers The right breast invasive ductal carcinoma treated with lumpectomy and radiation and observation Left breast invasive ductal carcinoma diagnosed December 2014 treated with left mastectomy ER/PR positive HER-2 negative Ki-67 20%, currently on tamoxifen 20 mg daily since January 2015  Tamoxifen toxicities: Patient has not been experiencing any side effects of tamoxifen. She denies any hot flashes or myalgias no problems with blood clots or uterine bleeding. Has a recommended continuing the treatment we will see her back in one year for followup to discuss whether or not she needs to continue further. Given her advanced age, if she has any side effects, we would discontinue tamoxifen therapy.  Surveillance: Today's breast exam did not reveal any abnormalities or lymphadenopathy.

## 2014-06-25 NOTE — Telephone Encounter (Signed)
per pof to sch pt appt-gave pt copy of sch °

## 2014-06-25 NOTE — Progress Notes (Signed)
Patient Care Team: Lajean Manes, MD as PCP - General  DIAGNOSIS: Breast cancer of upper-inner quadrant of left female breast   Staging form: Breast, AJCC 7th Edition     Clinical: No stage assigned - Unsigned     Pathologic: Stage IIA (T2, NX, cM0) - Signed by Deatra Robinson, MD on 09/16/2013  BREAST CANCER HISTORY: [name] is a 80 y.o woman with multiple medical problems including prior history of right-sided breast cancer. At that time patient underwent a lumpectomy followed by radiation therapy and then observation. She has no evidence of recurrent disease on the right side. Most recently in December 2014 patient felt a lump in the left breast. She went on to have workup performed including mammogram and ultrasound. The ultrasound and mammogram performed on 07/23/2013 showed a spiculated mass in the left upper inner quadrant measuring 1.9 cm. There were a right lumpectomy changes noted but no other abnormalities. Ultrasound showed an irregular mass at 10:30 o'clock position 3 cm from the nipple measuring 1.8 x 1.6 x 1.6 cm. 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   CURRENT THERAPY: Tamoxifen 20mg  daily  CHIEF COMPLIANT: annual followup of breast cancer  INTERVAL HISTORY: [name] is a 80 year old Caucasian lady with above-mentioned history of bilateral breast cancers who underwent left mastectomy in January 2015. She has been on oral antiestrogen therapy since then. She reports that tamoxifen is not causing trouble. Denies any hot flashes or myalgias. She does have a lot of arthritis symptoms but she does not attributed to tamoxifen. Denies any new complaints or concerns denies any new lumps or nodules.  REVIEW OF SYSTEMS:   Constitutional: Denies fevers, chills or abnormal weight loss Eyes: Denies blurriness of vision Ears, nose, mouth, throat, and face: Denies  mucositis or sore throat Respiratory: Denies cough, dyspnea or wheezes Cardiovascular: Denies palpitation, chest discomfort or lower extremity swelling Gastrointestinal:  Denies nausea, heartburn or change in bowel habits Skin: Denies abnormal skin rashes Lymphatics: Denies new lymphadenopathy or easy bruising Neurological:Denies numbness, tingling or new weaknesses Behavioral/Psych: Mood is stable, no new changes  Breast:  denies any pain or lumps or nodules in right breast,  Left chest wall normal All other systems were reviewed with the patient and are negative.  I have reviewed the past medical history, past surgical history, social history and family history with the patient and they are unchanged from previous note.  ALLERGIES:  is allergic to morphine and related.  MEDICATIONS:  Current Outpatient Prescriptions  Medication Sig Dispense Refill  . alendronate (FOSAMAX) 35 MG tablet Take 35 mg by mouth every 7 (seven) days. On Tuesday    . amLODipine (NORVASC) 5 MG tablet Take 5 mg by mouth daily.     Marland Kitchen aspirin 81 MG tablet Take 81 mg by mouth daily.    . brimonidine (ALPHAGAN) 0.2 % ophthalmic solution Place 1 drop into both eyes 2 (two) times daily.     . cholecalciferol (VITAMIN D-400) 400 UNITS TABS tablet Take 400 Units by mouth daily.    Marland Kitchen ketoconazole (NIZORAL) 2 % cream     . levothyroxine (SYNTHROID, LEVOTHROID) 112 MCG tablet Take 112 mcg by mouth daily before breakfast.     . methocarbamol (ROBAXIN) 500 MG tablet     . mirtazapine (REMERON) 15 MG tablet Take 15 mg by mouth at bedtime.     Marland Kitchen omeprazole (PRILOSEC)  20 MG capsule Take 20 mg by mouth daily.     Marland Kitchen oxyCODONE-acetaminophen (PERCOCET) 10-325 MG per tablet Take 1 tablet by mouth every 6 (six) hours as needed for pain.     . OXYCONTIN 30 MG T12A Take 30 mg by mouth every 12 (twelve) hours.     . polyethylene glycol (MIRALAX / GLYCOLAX) packet Take 17 g by mouth daily.    Marland Kitchen rOPINIRole (REQUIP) 0.5 MG tablet Take 0.5  mg by mouth daily.     . tamoxifen (NOLVADEX) 20 MG tablet      No current facility-administered medications for this visit.    PHYSICAL EXAMINATION: ECOG PERFORMANCE STATUS: 2 - Symptomatic, <50% confined to bed  Filed Vitals:   06/25/14 1112  BP: 130/61  Pulse: 70  Temp: 98.4 F (36.9 C)  Resp: 18   Filed Weights   06/25/14 1112  Weight: 112 lb (50.803 kg)    GENERAL:alert, no distress and comfortable SKIN: skin color, texture, turgor are normal, no rashes or significant lesions EYES: normal, Conjunctiva are pink and non-injected, sclera clear OROPHARYNX:no exudate, no erythema and lips, buccal mucosa, and tongue normal  NECK: supple, thyroid normal size, non-tender, without nodularity LYMPH:  no palpable lymphadenopathy in the cervical, axillary or inguinal LUNGS: clear to auscultation and percussion with normal breathing effort HEART: regular rate & rhythm and no murmurs and no lower extremity edema ABDOMEN:abdomen soft, non-tender and normal bowel sounds Musculoskeletal:no cyanosis of digits and no clubbing  NEURO: alert & oriented x 3 with fluent speech, no focal motor/sensory deficits BREAST: No palpable masses or nodules in right breast. No palpable axillary supraclavicular or infraclavicular adenopathy no breast tenderness or nipple discharge.   LABORATORY DATA:  I have reviewed the data as listed   Chemistry      Component Value Date/Time   NA 143 12/23/2013 1020   NA 144 09/03/2013 1106   K 4.1 12/23/2013 1020   K 4.5 09/03/2013 1106   CL 106 09/03/2013 1106   CO2 27 12/23/2013 1020   CO2 24 09/03/2013 1106   BUN 19.7 12/23/2013 1020   BUN 18 09/03/2013 1106   CREATININE 1.1 12/23/2013 1020   CREATININE 0.95 09/03/2013 1106      Component Value Date/Time   CALCIUM 9.2 12/23/2013 1020   CALCIUM 9.4 09/03/2013 1106   ALKPHOS 58 12/23/2013 1020   ALKPHOS 94 09/03/2013 1106   AST 18 12/23/2013 1020   AST 20 09/03/2013 1106   ALT 10 12/23/2013 1020    ALT 8 09/03/2013 1106   BILITOT 0.25 12/23/2013 1020   BILITOT <0.2* 09/03/2013 1106       Lab Results  Component Value Date   WBC 6.2 12/23/2013   HGB 12.1 12/23/2013   HCT 37.4 12/23/2013   MCV 95.5 12/23/2013   PLT 196 12/23/2013   NEUTROABS 4.7 12/23/2013   ASSESSMENT & PLAN:  Breast cancer of upper-inner quadrant of left female breast Bilateral breast cancers The right breast invasive ductal carcinoma treated with lumpectomy and radiation and observation Left breast invasive ductal carcinoma diagnosed December 2014 treated with left mastectomy ER/PR positive HER-2 negative Ki-67 20%, currently on tamoxifen 20 mg daily since January 2015  Tamoxifen toxicities: Patient has not been experiencing any side effects of tamoxifen. She denies any hot flashes or myalgias no problems with blood clots or uterine bleeding. Has a recommended continuing the treatment we will see her back in one year for followup to discuss whether or not she needs to  continue further. Given her advanced age, if she has any side effects, we would discontinue tamoxifen therapy.  Surveillance: Today's breast exam did not reveal any abnormalities or lymphadenopathy.   No orders of the defined types were placed in this encounter.   The patient has a good understanding of the overall plan. she agrees with it. She will call with any problems that may develop before her next visit here.   Rulon Eisenmenger, MD 06/25/2014 12:20 PM

## 2014-09-30 ENCOUNTER — Other Ambulatory Visit: Payer: Self-pay | Admitting: Geriatric Medicine

## 2014-09-30 ENCOUNTER — Ambulatory Visit
Admission: RE | Admit: 2014-09-30 | Discharge: 2014-09-30 | Disposition: A | Discharge status: Home / Self Care | Payer: Medicare Other | Source: Ambulatory Visit | Attending: Geriatric Medicine | Admitting: Geriatric Medicine

## 2014-09-30 DIAGNOSIS — R1030 Lower abdominal pain, unspecified: Secondary | ICD-10-CM

## 2014-10-02 ENCOUNTER — Encounter (HOSPITAL_COMMUNITY): Payer: Self-pay | Admitting: Emergency Medicine

## 2014-10-02 ENCOUNTER — Inpatient Hospital Stay (HOSPITAL_COMMUNITY)
Admission: EM | Admit: 2014-10-02 | Discharge: 2014-10-06 | DRG: 392 | Disposition: A | Discharge status: Skilled Nursing Facility | Payer: Medicare Other | Attending: Internal Medicine | Admitting: Internal Medicine

## 2014-10-02 DIAGNOSIS — K5900 Constipation, unspecified: Secondary | ICD-10-CM | POA: Diagnosis present

## 2014-10-02 DIAGNOSIS — G4700 Insomnia, unspecified: Secondary | ICD-10-CM | POA: Diagnosis present

## 2014-10-02 DIAGNOSIS — R5381 Other malaise: Secondary | ICD-10-CM | POA: Diagnosis present

## 2014-10-02 DIAGNOSIS — Z79810 Long term (current) use of selective estrogen receptor modulators (SERMs): Secondary | ICD-10-CM | POA: Diagnosis not present

## 2014-10-02 DIAGNOSIS — I10 Essential (primary) hypertension: Secondary | ICD-10-CM | POA: Diagnosis present

## 2014-10-02 DIAGNOSIS — C50919 Malignant neoplasm of unspecified site of unspecified female breast: Secondary | ICD-10-CM | POA: Diagnosis not present

## 2014-10-02 DIAGNOSIS — M810 Age-related osteoporosis without current pathological fracture: Secondary | ICD-10-CM | POA: Diagnosis present

## 2014-10-02 DIAGNOSIS — Z9012 Acquired absence of left breast and nipple: Secondary | ICD-10-CM

## 2014-10-02 DIAGNOSIS — Z9841 Cataract extraction status, right eye: Secondary | ICD-10-CM

## 2014-10-02 DIAGNOSIS — Z853 Personal history of malignant neoplasm of breast: Secondary | ICD-10-CM | POA: Diagnosis not present

## 2014-10-02 DIAGNOSIS — R1013 Epigastric pain: Secondary | ICD-10-CM | POA: Diagnosis not present

## 2014-10-02 DIAGNOSIS — K5732 Diverticulitis of large intestine without perforation or abscess without bleeding: Secondary | ICD-10-CM | POA: Diagnosis present

## 2014-10-02 DIAGNOSIS — Z9010 Acquired absence of unspecified breast and nipple: Secondary | ICD-10-CM | POA: Diagnosis present

## 2014-10-02 DIAGNOSIS — M1990 Unspecified osteoarthritis, unspecified site: Secondary | ICD-10-CM | POA: Diagnosis present

## 2014-10-02 DIAGNOSIS — H409 Unspecified glaucoma: Secondary | ICD-10-CM | POA: Diagnosis present

## 2014-10-02 DIAGNOSIS — G8929 Other chronic pain: Secondary | ICD-10-CM | POA: Diagnosis present

## 2014-10-02 DIAGNOSIS — E038 Other specified hypothyroidism: Secondary | ICD-10-CM | POA: Diagnosis not present

## 2014-10-02 DIAGNOSIS — K219 Gastro-esophageal reflux disease without esophagitis: Secondary | ICD-10-CM | POA: Diagnosis present

## 2014-10-02 DIAGNOSIS — R112 Nausea with vomiting, unspecified: Secondary | ICD-10-CM

## 2014-10-02 DIAGNOSIS — H3530 Unspecified macular degeneration: Secondary | ICD-10-CM | POA: Diagnosis present

## 2014-10-02 DIAGNOSIS — K5792 Diverticulitis of intestine, part unspecified, without perforation or abscess without bleeding: Secondary | ICD-10-CM | POA: Diagnosis present

## 2014-10-02 DIAGNOSIS — K5901 Slow transit constipation: Secondary | ICD-10-CM | POA: Diagnosis not present

## 2014-10-02 DIAGNOSIS — E039 Hypothyroidism, unspecified: Secondary | ICD-10-CM | POA: Diagnosis present

## 2014-10-02 DIAGNOSIS — Z9842 Cataract extraction status, left eye: Secondary | ICD-10-CM | POA: Diagnosis not present

## 2014-10-02 DIAGNOSIS — Z886 Allergy status to analgesic agent status: Secondary | ICD-10-CM | POA: Diagnosis not present

## 2014-10-02 DIAGNOSIS — Z87891 Personal history of nicotine dependence: Secondary | ICD-10-CM

## 2014-10-02 DIAGNOSIS — R109 Unspecified abdominal pain: Secondary | ICD-10-CM | POA: Diagnosis present

## 2014-10-02 LAB — COMPREHENSIVE METABOLIC PANEL
ALBUMIN: 3.8 g/dL (ref 3.5–5.2)
ALK PHOS: 56 U/L (ref 39–117)
ALT: 11 U/L (ref 0–35)
AST: 21 U/L (ref 0–37)
Anion gap: 9 (ref 5–15)
BILIRUBIN TOTAL: 0.5 mg/dL (ref 0.3–1.2)
BUN: 16 mg/dL (ref 6–23)
CHLORIDE: 105 mmol/L (ref 96–112)
CO2: 27 mmol/L (ref 19–32)
CREATININE: 1.02 mg/dL (ref 0.50–1.10)
Calcium: 8.7 mg/dL (ref 8.4–10.5)
GFR calc Af Amer: 55 mL/min — ABNORMAL LOW (ref >90)
GFR calc non Af Amer: 48 mL/min — ABNORMAL LOW (ref >90)
Glucose, Bld: 115 mg/dL — ABNORMAL HIGH (ref 70–99)
Potassium: 4 mmol/L (ref 3.5–5.1)
Sodium: 141 mmol/L (ref 135–145)
Total Protein: 6.9 g/dL (ref 6.0–8.3)

## 2014-10-02 LAB — URINALYSIS, ROUTINE W REFLEX MICROSCOPIC
Bilirubin Urine: NEGATIVE
Glucose, UA: NEGATIVE mg/dL
Ketones, ur: NEGATIVE mg/dL
Leukocytes, UA: NEGATIVE
Nitrite: NEGATIVE
PROTEIN: 30 mg/dL — AB
Specific Gravity, Urine: 1.011 (ref 1.005–1.030)
Urobilinogen, UA: 0.2 mg/dL (ref 0.0–1.0)
pH: 7.5 (ref 5.0–8.0)

## 2014-10-02 LAB — CBC WITH DIFFERENTIAL/PLATELET
BASOS ABS: 0 10*3/uL (ref 0.0–0.1)
Basophils Relative: 0 % (ref 0–1)
EOS ABS: 0 10*3/uL (ref 0.0–0.7)
EOS PCT: 0 % (ref 0–5)
HEMATOCRIT: 39.7 % (ref 36.0–46.0)
Hemoglobin: 12.5 g/dL (ref 12.0–15.0)
Lymphocytes Relative: 10 % — ABNORMAL LOW (ref 12–46)
Lymphs Abs: 0.7 10*3/uL (ref 0.7–4.0)
MCH: 30.9 pg (ref 26.0–34.0)
MCHC: 31.5 g/dL (ref 30.0–36.0)
MCV: 98 fL (ref 78.0–100.0)
Monocytes Absolute: 0.3 10*3/uL (ref 0.1–1.0)
Monocytes Relative: 5 % (ref 3–12)
Neutro Abs: 5.8 10*3/uL (ref 1.7–7.7)
Neutrophils Relative %: 85 % — ABNORMAL HIGH (ref 43–77)
PLATELETS: 223 10*3/uL (ref 150–400)
RBC: 4.05 MIL/uL (ref 3.87–5.11)
RDW: 13.6 % (ref 11.5–15.5)
WBC: 6.8 10*3/uL (ref 4.0–10.5)

## 2014-10-02 LAB — URINE MICROSCOPIC-ADD ON

## 2014-10-02 LAB — LIPASE, BLOOD: Lipase: 15 U/L (ref 11–59)

## 2014-10-02 LAB — I-STAT CG4 LACTIC ACID, ED: Lactic Acid, Venous: 0.75 mmol/L (ref 0.5–2.0)

## 2014-10-02 MED ORDER — HEPARIN SODIUM (PORCINE) 5000 UNIT/ML IJ SOLN
5000.0000 [IU] | Freq: Three times a day (TID) | INTRAMUSCULAR | Status: DC
  Administered 2014-10-02 – 2014-10-06 (×11): 5000 [IU] via SUBCUTANEOUS
  Filled 2014-10-02 (×14): qty 1

## 2014-10-02 MED ORDER — AMLODIPINE BESYLATE 2.5 MG PO TABS
2.5000 mg | ORAL_TABLET | Freq: Every day | ORAL | Status: DC
  Administered 2014-10-02: 2.5 mg via ORAL
  Filled 2014-10-02 (×2): qty 1

## 2014-10-02 MED ORDER — METRONIDAZOLE IN NACL 5-0.79 MG/ML-% IV SOLN
500.0000 mg | Freq: Three times a day (TID) | INTRAVENOUS | Status: DC
  Administered 2014-10-02 – 2014-10-06 (×11): 500 mg via INTRAVENOUS
  Filled 2014-10-02 (×13): qty 100

## 2014-10-02 MED ORDER — FENTANYL CITRATE 0.05 MG/ML IJ SOLN: 50.0000 ug | INTRAMUSCULAR | Status: DC | PRN

## 2014-10-02 MED ORDER — FENTANYL CITRATE 0.05 MG/ML IJ SOLN
50.0000 ug | Freq: Once | INTRAMUSCULAR | Status: AC
  Administered 2014-10-02: 50 ug via INTRAVENOUS
  Filled 2014-10-02: qty 2

## 2014-10-02 MED ORDER — TAMOXIFEN CITRATE 10 MG PO TABS
10.0000 mg | ORAL_TABLET | Freq: Every day | ORAL | Status: DC
  Administered 2014-10-03 – 2014-10-06 (×4): 10 mg via ORAL
  Filled 2014-10-02 (×6): qty 1

## 2014-10-02 MED ORDER — CIPROFLOXACIN IN D5W 400 MG/200ML IV SOLN
400.0000 mg | Freq: Two times a day (BID) | INTRAVENOUS | Status: DC
  Administered 2014-10-03 – 2014-10-06 (×7): 400 mg via INTRAVENOUS
  Filled 2014-10-02 (×8): qty 200

## 2014-10-02 MED ORDER — ONDANSETRON HCL 4 MG/2ML IJ SOLN
4.0000 mg | Freq: Four times a day (QID) | INTRAMUSCULAR | Status: DC | PRN
  Administered 2014-10-02 – 2014-10-03 (×4): 4 mg via INTRAVENOUS
  Filled 2014-10-02 (×4): qty 2

## 2014-10-02 MED ORDER — BRIMONIDINE TARTRATE 0.2 % OP SOLN
1.0000 [drp] | Freq: Two times a day (BID) | OPHTHALMIC | Status: DC
  Administered 2014-10-02 – 2014-10-06 (×8): 1 [drp] via OPHTHALMIC
  Filled 2014-10-02: qty 5

## 2014-10-02 MED ORDER — SODIUM CHLORIDE 0.9 % IV SOLN: 250.0000 mL | INTRAVENOUS | Status: DC | PRN

## 2014-10-02 MED ORDER — MIRTAZAPINE 15 MG PO TABS
15.0000 mg | ORAL_TABLET | Freq: Every day | ORAL | Status: DC
  Administered 2014-10-02 – 2014-10-05 (×4): 15 mg via ORAL
  Filled 2014-10-02 (×5): qty 1

## 2014-10-02 MED ORDER — CIPROFLOXACIN IN D5W 400 MG/200ML IV SOLN
400.0000 mg | Freq: Once | INTRAVENOUS | Status: AC
Start: 2014-10-02 — End: 2014-10-02
  Administered 2014-10-02: 400 mg via INTRAVENOUS
  Filled 2014-10-02: qty 200

## 2014-10-02 MED ORDER — FENTANYL CITRATE 0.05 MG/ML IJ SOLN: 25.0000 ug | INTRAMUSCULAR | Status: DC | PRN

## 2014-10-02 MED ORDER — METRONIDAZOLE IN NACL 5-0.79 MG/ML-% IV SOLN
500.0000 mg | Freq: Once | INTRAVENOUS | Status: AC
  Administered 2014-10-02: 500 mg via INTRAVENOUS
  Filled 2014-10-02: qty 100

## 2014-10-02 MED ORDER — ROPINIROLE HCL 0.5 MG PO TABS
0.5000 mg | ORAL_TABLET | Freq: Every day | ORAL | Status: DC
  Administered 2014-10-02 – 2014-10-06 (×5): 0.5 mg via ORAL
  Filled 2014-10-02 (×5): qty 1

## 2014-10-02 MED ORDER — ASPIRIN EC 81 MG PO TBEC
81.0000 mg | DELAYED_RELEASE_TABLET | Freq: Every day | ORAL | Status: DC
  Administered 2014-10-03 – 2014-10-06 (×4): 81 mg via ORAL
  Filled 2014-10-02 (×4): qty 1

## 2014-10-02 MED ORDER — SODIUM CHLORIDE 0.9 % IV BOLUS (SEPSIS)
500.0000 mL | Freq: Once | INTRAVENOUS | Status: AC
  Administered 2014-10-02: 500 mL via INTRAVENOUS

## 2014-10-02 MED ORDER — POLYETHYLENE GLYCOL 3350 17 G PO PACK
17.0000 g | PACK | Freq: Every day | ORAL | Status: DC
  Administered 2014-10-02 – 2014-10-04 (×3): 17 g via ORAL
  Filled 2014-10-02 (×4): qty 1

## 2014-10-02 MED ORDER — SODIUM CHLORIDE 0.9 % IJ SOLN
3.0000 mL | Freq: Two times a day (BID) | INTRAMUSCULAR | Status: DC
  Administered 2014-10-02 – 2014-10-05 (×5): 3 mL via INTRAVENOUS

## 2014-10-02 MED ORDER — POLYETHYLENE GLYCOL 3350 17 GM/SCOOP PO POWD
17.0000 g | Freq: Every day | ORAL | Status: DC
  Filled 2014-10-02: qty 255

## 2014-10-02 MED ORDER — MORPHINE SULFATE 2 MG/ML IJ SOLN
1.0000 mg | INTRAMUSCULAR | Status: DC | PRN
  Filled 2014-10-02: qty 1

## 2014-10-02 MED ORDER — SODIUM CHLORIDE 0.9 % IJ SOLN: 3.0000 mL | INTRAMUSCULAR | Status: DC | PRN

## 2014-10-02 MED ORDER — OXYCODONE HCL ER 15 MG PO T12A
30.0000 mg | EXTENDED_RELEASE_TABLET | Freq: Two times a day (BID) | ORAL | Status: DC
  Administered 2014-10-02 – 2014-10-06 (×8): 30 mg via ORAL
  Filled 2014-10-02 (×8): qty 2

## 2014-10-02 MED ORDER — LEVOTHYROXINE SODIUM 112 MCG PO TABS
112.0000 ug | ORAL_TABLET | Freq: Every day | ORAL | Status: DC
  Administered 2014-10-03 – 2014-10-06 (×4): 112 ug via ORAL
  Filled 2014-10-02 (×5): qty 1

## 2014-10-02 MED ORDER — ONDANSETRON HCL 4 MG/2ML IJ SOLN
4.0000 mg | Freq: Once | INTRAMUSCULAR | Status: AC
  Administered 2014-10-02: 4 mg via INTRAVENOUS
  Filled 2014-10-02: qty 2

## 2014-10-02 MED ORDER — CHOLECALCIFEROL 10 MCG (400 UNIT) PO TABS
400.0000 [IU] | ORAL_TABLET | Freq: Every day | ORAL | Status: DC
  Administered 2014-10-03 – 2014-10-06 (×4): 400 [IU] via ORAL
  Filled 2014-10-02 (×4): qty 1

## 2014-10-02 NOTE — Progress Notes (Addendum)
CSW received call from [place]. He states that he spoke with 1st shift CSW and is calling back for an update.  CSW met with patient at bedside.There was no family present. Patient informed CSW that she presents to [facility] due to abdominal pain. Patient stated that she has been constipated recently. Also, she stated that she has vomited.  Patient informed CSW that she is able to feed herself and put on her clothes independently. However, she states that staff at the facility assist her with bathing. Patient states that she is close with her son who lives in [place]. Patient informed CSW that her son can be considered her primary support.  Patient states that she does not have any questions.  Patient states that she feels better than when she initially came into [facility]. CSW consulted with nurse who states that the pt will be admitted. CSW reached out to [employer] and informed him that the pt will be admitted.  Willette Brace [phone] ED CSW 10/02/2014 5:44 PM

## 2014-10-02 NOTE — ED Notes (Signed)
Pt states that she was diagnosed with diverticulitis and she has some constipation. Pt states that she has been taking antibiotics and Miralax.  Pt states that her abd/esophagus/throat are sore from all the vomiting.

## 2014-10-02 NOTE — ED Notes (Signed)
Floor nurse unable to take report at this time. 

## 2014-10-02 NOTE — ED Notes (Signed)
Pt presents to ED from [facility] for abd pain and  N/V. Pt was seen at PCP 2 days ago for constipation. Pt has hx of diverticulitis.

## 2014-10-02 NOTE — H&P (Signed)
[facility] History and Physical  Treonna Klee Daley VEH:[id number] DOB: May 23, 1924 DOA: 10/02/2014  Referring physician: Dr. Ralene Bathe PCP: Mathews Argyle, MD   Chief Complaint: abdominal discomfort  HPI: [name] is a 81 y.o. female  With history of breast cancer presenting to the ED complaining of one week of abdominal discomfort. The problem since onset has been persistent and gradually getting worse. Patient presented to her primary care physicians 2 days prior to admission date for evaluation of which CT abdomen and pelvis was obtained. There was mention of diverticulitis with no abscess. Patient states that her condition worsened as such presented to the hospital for further evaluation. Given her recent imaging patient was not rescanned and we were consulted for further medical evaluation recommendations. Patient does also endorse about of emesis earlier this morning.   Review of Systems:  Constitutional:  No weight loss, night sweats, Fevers, chills, fatigue.  HEENT:  No headaches, Difficulty swallowing,Tooth/dental problems,Sore throat,  No sneezing, itching, ear ache, nasal congestion, post nasal drip,  Cardio-vascular:  No chest pain, Orthopnea, PND, swelling in lower extremities, anasarca, dizziness, palpitations  GI:  No heartburn, indigestion, +abdominal pain, nausea,+ vomiting, diarrhea, change in bowel habits, loss of appetite  Resp:  No shortness of breath with exertion or at rest. No excess mucus, no productive cough, No non-productive cough, No coughing up of blood.No change in color of mucus.No wheezing.No chest wall deformity  Skin:  no rash or lesions.  GU:  no dysuria, change in color of urine, no urgency or frequency. No flank pain.  Musculoskeletal:  No joint pain or swelling. No decreased range of motion. No back pain.  Psych:  No change in mood or affect. No depression or anxiety. No memory loss.   Past Medical History  Diagnosis Date  . Cancer      breast  . Hypertension     takes Amlodipine daily  . Hypothyroidism     takes Synthroid daily  . GERD (gastroesophageal reflux disease)     takes Omeprazole daily  . Constipation     takes Miralax every other day  . Macular degeneration     dry  . Glaucoma     uses eye drops  . Complication of anesthesia     slow to wake up  . Dysphagia   . Arthritis   . Joint pain   . Joint swelling     knees  . Low back pain   . Osteoporosis     takes Fosamax weekly  . Diverticulosis   . Colitis   . Urinary frequency   . Nocturia   . Insomnia     takes Remeron nightly  . History of shingles   . Diverticulitis    Past Surgical History  Procedure Laterality Date  . Breast surgery  1995    rt breast ca  . Shoulder arthroscopy with labral repair      bilateral shoulder w/rod  . Hand surgery      bilateral hands  . Knee fusion    . Hip pinning    . Ear skin    . Back surgery    . Cesarean section      x 3  . Colonoscopy    . Teeth extracted    . Neck surgery    . Cataracts Bilateral     removed  . Total mastectomy Left 09/02/2013    DR WAKEFIELD  . Simple mastectomy with axillary sentinel node biopsy Left 09/03/2013  Procedure:  MASTECTOMY;  Surgeon: Rolm Bookbinder, MD;  Location: [facility];  Service: General;  Laterality: Left;   Social History:  reports that she has quit smoking. She has never used smokeless tobacco. She reports that she does not drink alcohol or use illicit drugs.  Allergies  Allergen Reactions  . Morphine And Related     HALLUCINATIONS    family history  - Son had esophageal problems. No other family history reported  Prior to Admission medications   Medication Sig Start Date End Date Taking? Authorizing Provider  amLODipine (NORVASC) 2.5 MG tablet Take 1 tablet by mouth daily. 09/08/14  Yes Historical Provider, MD  aspirin 81 MG tablet Take 81 mg by mouth daily.   Yes Historical Provider, MD  brimonidine (ALPHAGAN) 0.2 % ophthalmic solution  Place 1 drop into both eyes 2 (two) times daily.  07/24/13  Yes Historical Provider, MD  levothyroxine (SYNTHROID, LEVOTHROID) 112 MCG tablet Take 112 mcg by mouth daily before breakfast.  07/08/13  Yes Historical Provider, MD  methadone (DOLOPHINE) 5 MG tablet Take 1 tablet by mouth every 12 (twelve) hours. 09/24/14  Yes Historical Provider, MD  methocarbamol (ROBAXIN) 500 MG tablet  09/11/13  Yes Historical Provider, MD  mirtazapine (REMERON) 15 MG tablet Take 15 mg by mouth at bedtime.  07/29/13  Yes Historical Provider, MD  omeprazole (PRILOSEC) 40 MG capsule Take 1 capsule by mouth daily. 09/29/14  Yes Historical Provider, MD  oxyCODONE-acetaminophen (PERCOCET) 10-325 MG per tablet Take 1 tablet by mouth 5 (five) times daily.  07/11/13  Yes Historical Provider, MD  polyethylene glycol powder (GLYCOLAX/MIRALAX) powder Take 17 g by mouth daily. 09/29/14  Yes Historical Provider, MD  rOPINIRole (REQUIP) 0.5 MG tablet Take 0.5 mg by mouth daily.  07/08/13  Yes Historical Provider, MD  tamoxifen (NOLVADEX) 10 MG tablet Take 1 tablet by mouth daily. 07/27/14  Yes Historical Provider, MD  alendronate (FOSAMAX) 35 MG tablet Take 35 mg by mouth every 7 (seven) days. On Tuesday 07/08/13   Historical Provider, MD  cholecalciferol (VITAMIN D-400) 400 UNITS TABS tablet Take 400 Units by mouth daily.    Historical Provider, MD  ketoconazole (NIZORAL) 2 % cream  07/24/13   Historical Provider, MD  OXYCONTIN 30 MG T12A Take 30 mg by mouth every 12 (twelve) hours.  07/11/13   Historical Provider, MD   Physical Exam: Filed Vitals:   10/02/14 1527 10/02/14 1530 10/02/14 1600 10/02/14 1630  BP: 184/65 171/65 164/66 131/100  Pulse: 73 70 69 68  Temp:      TempSrc:      Resp: 13 12 13 14   SpO2: 98% 93% 92% 93%    Wt Readings from Last 3 Encounters:  06/25/14 50.803 kg (112 lb)  12/23/13 47.265 kg (104 lb 3.2 oz)  09/16/13 46.675 kg (102 lb 14.4 oz)    General:  Appears calm and comfortable Eyes: PERRL, normal  lids, irises & conjunctiva ENT: grossly normal hearing, lips & tongue Neck: no LAD, masses or thyromegaly Cardiovascular: RRR, no m/r/g. No LE edema. Respiratory: CTA bilaterally, no w/r/r. Normal respiratory effort. Abdomen: soft, discomfort with deep palpation in lower quadrants. Nondistended Skin: no rash or induration seen on limited exam Musculoskeletal: grossly normal tone BUE/BLE Psychiatric: grossly normal mood and affect, speech fluent and appropriate Neurologic: grossly non-focal.          Labs on Admission:  Basic Metabolic Panel:  Recent Labs Lab 10/02/14 1200  NA 141  K 4.0  CL 105  CO2 27  GLUCOSE 115*  BUN 16  CREATININE 1.02  CALCIUM 8.7   Liver Function Tests:  Recent Labs Lab 10/02/14 1200  AST 21  ALT 11  ALKPHOS 56  BILITOT 0.5  PROT 6.9  ALBUMIN 3.8    Recent Labs Lab 10/02/14 1200  LIPASE 15   No results for input(s): AMMONIA in the last 168 hours. CBC:  Recent Labs Lab 10/02/14 1200  WBC 6.8  NEUTROABS 5.8  HGB 12.5  HCT 39.7  MCV 98.0  PLT 223   Cardiac Enzymes: No results for input(s): CKTOTAL, CKMB, CKMBINDEX, TROPONINI in the last 168 hours.  BNP (last 3 results) No results for input(s): BNP in the last 8760 hours.  ProBNP (last 3 results) No results for input(s): PROBNP in the last 8760 hours.  CBG: No results for input(s): GLUCAP in the last 168 hours.  Radiological Exams on Admission: No results found.   Assessment/Plan Active Problems:   Acute diverticulitis -We'll place on Cipro and Flagyl -GI pathogen panel -Supportive therapy  History of breast cancer -Stable continue home regimen   Code Status: full code DVT Prophylaxis: heparin Family Communication: None at bedside Disposition Plan:   Time spent: > 45 minutes  Velvet Bathe [facility] Pager [phone]

## 2014-10-02 NOTE — ED Provider Notes (Signed)
CSN: [id number]     Arrival date & time 10/02/14  1035 History   First MD Initiated Contact with Patient 10/02/14 1109     Chief Complaint  Patient presents with  . Abdominal Pain  . Emesis     The history is provided by the patient. No language interpreter was used.   [name] presents to the emergency department for evaluation of abdominal pain. She reports about 1 week of lower abdominal pain. The pain is described as "pain" and is waxing and waning in nature. She saw her primary care provider 2 days ago and had a CT scan is diagnosed with diverticulitis and constipation and started on  antibiotics. She states that since that time her pain is worsened and she developed vomiting today. She denies any fevers, diarrhea, dysuria. Her last bowel movement was 2 days ago. Symptoms are moderate and worsening.  Past Medical History  Diagnosis Date  . Cancer     breast  . Hypertension     takes Amlodipine daily  . Hypothyroidism     takes Synthroid daily  . GERD (gastroesophageal reflux disease)     takes Omeprazole daily  . Constipation     takes Miralax every other day  . Macular degeneration     dry  . Glaucoma     uses eye drops  . Complication of anesthesia     slow to wake up  . Dysphagia   . Arthritis   . Joint pain   . Joint swelling     knees  . Low back pain   . Osteoporosis     takes Fosamax weekly  . Diverticulosis   . Colitis   . Urinary frequency   . Nocturia   . Insomnia     takes Remeron nightly  . History of shingles   . Diverticulitis    Past Surgical History  Procedure Laterality Date  . Breast surgery  1995    rt breast ca  . Shoulder arthroscopy with labral repair      bilateral shoulder w/rod  . Hand surgery      bilateral hands  . Knee fusion    . Hip pinning    . Ear skin    . Back surgery    . Cesarean section      x 3  . Colonoscopy    . Teeth extracted    . Neck surgery    . Cataracts Bilateral     removed  . Total mastectomy Left  09/02/2013    DR WAKEFIELD  . Simple mastectomy with axillary sentinel node biopsy Left 09/03/2013    Procedure:  MASTECTOMY;  Surgeon: Rolm Bookbinder, MD;  Location: [facility];  Service: General;  Laterality: Left;   No family history on file. History  Substance Use Topics  . Smoking status: Former Research scientist (life sciences)  . Smokeless tobacco: Never Used     Comment: quit 28yrs ago  . Alcohol Use: No   OB History    No data available     Review of Systems  All other systems reviewed and are negative.     Allergies  Morphine and related  Home Medications   Prior to Admission medications   Medication Sig Start Date End Date Taking? Authorizing Provider  amLODipine (NORVASC) 2.5 MG tablet Take 1 tablet by mouth daily. 09/08/14  Yes Historical Provider, MD  aspirin 81 MG tablet Take 81 mg by mouth daily.   Yes Historical Provider, MD  brimonidine [facility])  0.2 % ophthalmic solution Place 1 drop into both eyes 2 (two) times daily.  07/24/13  Yes Historical Provider, MD  levothyroxine (SYNTHROID, LEVOTHROID) 112 MCG tablet Take 112 mcg by mouth daily before breakfast.  07/08/13  Yes Historical Provider, MD  methadone (DOLOPHINE) 5 MG tablet Take 1 tablet by mouth every 12 (twelve) hours. 09/24/14  Yes Historical Provider, MD  methocarbamol (ROBAXIN) 500 MG tablet  09/11/13  Yes Historical Provider, MD  mirtazapine (REMERON) 15 MG tablet Take 15 mg by mouth at bedtime.  07/29/13  Yes Historical Provider, MD  omeprazole (PRILOSEC) 40 MG capsule Take 1 capsule by mouth daily. 09/29/14  Yes Historical Provider, MD  oxyCODONE-acetaminophen (PERCOCET) 10-325 MG per tablet Take 1 tablet by mouth 5 (five) times daily.  07/11/13  Yes Historical Provider, MD  polyethylene glycol powder (GLYCOLAX/MIRALAX) powder Take 17 g by mouth daily. 09/29/14  Yes Historical Provider, MD  rOPINIRole (REQUIP) 0.5 MG tablet Take 0.5 mg by mouth daily.  07/08/13  Yes Historical Provider, MD  tamoxifen (NOLVADEX) 10 MG tablet Take 1  tablet by mouth daily. 07/27/14  Yes Historical Provider, MD  alendronate (FOSAMAX) 35 MG tablet Take 35 mg by mouth every 7 (seven) days. On Tuesday 07/08/13   Historical Provider, MD  cholecalciferol (VITAMIN D-400) 400 UNITS TABS tablet Take 400 Units by mouth daily.    Historical Provider, MD  ketoconazole (NIZORAL) 2 % cream  07/24/13   Historical Provider, MD  OXYCONTIN 30 MG T12A Take 30 mg by mouth every 12 (twelve) hours.  07/11/13   Historical Provider, MD   BP 174/64 mmHg  Pulse 71  Temp(Src) 98.4 F (36.9 C) (Oral)  Resp 17  SpO2 98% Physical Exam  Constitutional: She is oriented to person, place, and time. She appears well-developed and well-nourished.  HENT:  Head: Normocephalic and atraumatic.  Cardiovascular: Normal rate and regular rhythm.   No murmur heard. Pulmonary/Chest: Effort normal and breath sounds normal. No respiratory distress.  Abdominal: Soft.  Moderate diffuse abdominal tenderness, greatest over the lower abdominal region. No voluntary guarding no rebound.  Musculoskeletal: She exhibits no edema or tenderness.  Neurological: She is alert and oriented to person, place, and time.  Skin: Skin is warm and dry.  Psychiatric: She has a normal mood and affect. Her behavior is normal.  Nursing note and vitals reviewed.   ED Course  Procedures (including critical care time) Labs Review Labs Reviewed  COMPREHENSIVE METABOLIC PANEL - Abnormal; Notable for the following:    Glucose, Bld 115 (*)    GFR calc non Af Amer 48 (*)    GFR calc Af Amer 55 (*)    All other components within normal limits  CBC WITH DIFFERENTIAL/PLATELET - Abnormal; Notable for the following:    Neutrophils Relative % 85 (*)    Lymphocytes Relative 10 (*)    All other components within normal limits  URINALYSIS, ROUTINE W REFLEX MICROSCOPIC - Abnormal; Notable for the following:    Hgb urine dipstick TRACE (*)    Protein, ur 30 (*)    All other components within normal limits   LIPASE, BLOOD  URINE MICROSCOPIC-ADD ON  I-STAT CG4 LACTIC ACID, ED    Imaging Review Ct Abdomen Pelvis Wo Contrast  09/30/2014   CLINICAL DATA:  Lower abdominal pain, RIGHT lower quadrant tenderness, decreased appetite, history BILATERAL breast cancer, question diverticulitis  EXAM: CT ABDOMEN AND PELVIS WITHOUT CONTRAST  TECHNIQUE: Multidetector CT imaging of the abdomen and pelvis was performed following the  standard protocol without IV contrast. Sagittal and coronal MPR images reconstructed from axial data set.  COMPARISON:  04/08/2013  FINDINGS: Minimal scarring at lung bases.  Scattered atherosclerotic calcifications aorta and coronary arteries. Small exophytic cysts in both kidneys.  Nonspecific low-attenuation focus lateral segment LEFT lobe liver 4 mm diameter image 20.  Remainder of liver, spleen, pancreas, kidneys, and adrenal glands normal.  Increased stool throughout colon consistent with constipation.  Minimal wall thickening of the cecum is questioned without inflammatory changes.  Wall thickening of the sigmoid colon with pericolic infiltrative changes.  Single sigmoid diverticulum identified.  This could represent subtle sigmoid diverticulitis or segmental colitis.  Stomach and small bowel loops normal appearance for technique.  Scattered normal size retroperitoneal nodes.  No mass, adenopathy, free fluid or free air otherwise identified.  Bones diffusely demineralized.  Orthopedic hardware proximal LEFT femur.  IMPRESSION: Questionable mild wall thickening of the cecum without surrounding inflammatory changes.  Wall thickening and pericolic infiltration at the sigmoid colon question related to subtle sigmoid diverticulitis versus segmental colitis.  Increased stool in colon clinically consider constipation.  No evidence of bowel perforation or abscess.   Electronically Signed   By: Lavonia Dana M.D.   On: 09/30/2014 14:37     EKG Interpretation None      MDM   Final diagnoses:   Diverticulitis of large intestine without perforation or abscess without bleeding  Non-intractable vomiting with nausea, vomiting of unspecified type    Patient with recent diagnosis of diverticulitis and started on antibiotics as an outpatient here with progressive abdominal pain and vomiting, and all inability to tolerate oral fluids at home. Patient does have significant lower abdominal tenderness, which is improved after pain medications. Patient does not have a surgical abdomen on recheck.  Discussed with hospitalist regarding admission for IV antibiotics given patient's vomiting at home.  Quintella Reichert, MD 10/02/14 [phone]

## 2014-10-02 NOTE — Progress Notes (Signed)
Pt from independent living at [facility]. If higher level of care needed please consult clinical social work department. If patient needs higher level of care over weekend, weekend supervisor at [facility] can be reached at [phone].   Belia Heman, [place] Work  [employer] [phone]

## 2014-10-02 NOTE — ED Notes (Signed)
Bed: WA04 Expected date:  Expected time:  Means of arrival:  Comments: EMS-abdominal pain 

## 2014-10-02 NOTE — Progress Notes (Signed)
Clinical Social Work Department BRIEF PSYCHOSOCIAL ASSESSMENT 10/02/2014  Patient:  [name], [name]     Account Number:  [id number]     Admit date:  10/02/2014  Clinical Social Worker:  Tilda Burrow, CLINICAL SOCIAL WORKER  Date/Time:  10/02/2014 11:00 AM  Referred by:  CSW  Date Referred:  10/02/2014 Referred for  Other - See comment   Other Referral:   Patient does not need a facility referral. She is currently living at [facility].   Interview type:  Patient Other interview type:   There was no family present.    PSYCHOSOCIAL DATA Living Status:  FACILITY Admitted from facility:   Level of care:  Assisted Living Primary support name:  Son Primary support relationship to patient:  CHILD, ADULT Degree of support available:   Patient informed CSW that she has a son who lives in [place]. She states that he is her primary support. Son was not present during the interview.    CURRENT CONCERNS Current Concerns  Adjustment to Illness   Other Concerns:   Patient informed CSW that she has still not made a bowel movement. However, she states that she feels better than when she initially came into [facility].    SOCIAL WORK ASSESSMENT / PLAN CSW received call from Yoe. He states that he spoke with 1st shift CSW and is calling back for an update.    CSW met with patient at bedside.There was no family present. Patient informed CSW that she presents to [facility] due to abdominal pain. Patient stated that she has been constipated recently. Also, she stated that she has vomited.    Patient informed CSW that she is able to feed herself and put on her clothes independently. However, she states that staff at the facility assist her with bathing. Patient states that she is close with her son who lives in [place]. Patient informed CSW that her son can be considered her primary support.    Patient states that she does not have any questions.    Patient states that she feels  better than when she initially came into [facility]. CSW consulted with nurse who states that the pt will be admitted. CSW reached out to [employer] and informed him that the pt will be admitted.   Assessment/plan status:  No Further Intervention Required Other assessment/ plan:   Information/referral to community resources:   Patient does not need a referral. She is currently living at [facility].    PATIENT'S/FAMILY'S RESPONSE TO PLAN OF CARE: There was no family present. Patient is aware that she is being admitted. Patient states that she does not have any questions.      Willette Brace [phone] ED CSW 10/02/2014 11:11 PM

## 2014-10-03 DIAGNOSIS — R1013 Epigastric pain: Secondary | ICD-10-CM

## 2014-10-03 DIAGNOSIS — R5381 Other malaise: Secondary | ICD-10-CM

## 2014-10-03 DIAGNOSIS — G8929 Other chronic pain: Secondary | ICD-10-CM

## 2014-10-03 DIAGNOSIS — K5901 Slow transit constipation: Secondary | ICD-10-CM

## 2014-10-03 LAB — CBC
HCT: 35.3 % — ABNORMAL LOW (ref 36.0–46.0)
Hemoglobin: 11.1 g/dL — ABNORMAL LOW (ref 12.0–15.0)
MCH: 30.7 pg (ref 26.0–34.0)
MCHC: 31.4 g/dL (ref 30.0–36.0)
MCV: 97.8 fL (ref 78.0–100.0)
PLATELETS: 193 10*3/uL (ref 150–400)
RBC: 3.61 MIL/uL — AB (ref 3.87–5.11)
RDW: 13.7 % (ref 11.5–15.5)
WBC: 6.2 10*3/uL (ref 4.0–10.5)

## 2014-10-03 LAB — BASIC METABOLIC PANEL
ANION GAP: 9 (ref 5–15)
BUN: 14 mg/dL (ref 6–23)
CHLORIDE: 105 mmol/L (ref 96–112)
CO2: 26 mmol/L (ref 19–32)
Calcium: 8.2 mg/dL — ABNORMAL LOW (ref 8.4–10.5)
Creatinine, Ser: 0.99 mg/dL (ref 0.50–1.10)
GFR, EST AFRICAN AMERICAN: 57 mL/min — AB (ref >90)
GFR, EST NON AFRICAN AMERICAN: 49 mL/min — AB (ref >90)
Glucose, Bld: 90 mg/dL (ref 70–99)
POTASSIUM: 3.4 mmol/L — AB (ref 3.5–5.1)
Sodium: 140 mmol/L (ref 135–145)

## 2014-10-03 MED ORDER — AMLODIPINE BESYLATE 10 MG PO TABS
10.0000 mg | ORAL_TABLET | Freq: Every day | ORAL | Status: DC
  Administered 2014-10-03 – 2014-10-06 (×4): 10 mg via ORAL
  Filled 2014-10-03 (×4): qty 1

## 2014-10-03 MED ORDER — HYDROMORPHONE HCL 1 MG/ML IJ SOLN
0.5000 mg | INTRAMUSCULAR | Status: DC | PRN
  Administered 2014-10-03: 0.5 mg via INTRAVENOUS
  Filled 2014-10-03: qty 1

## 2014-10-03 MED ORDER — SENNOSIDES-DOCUSATE SODIUM 8.6-50 MG PO TABS
1.0000 | ORAL_TABLET | Freq: Two times a day (BID) | ORAL | Status: DC
  Administered 2014-10-03 – 2014-10-06 (×7): 1 via ORAL
  Filled 2014-10-03 (×8): qty 1

## 2014-10-03 MED ORDER — FLEET ENEMA 7-19 GM/118ML RE ENEM
1.0000 | ENEMA | Freq: Once | RECTAL | Status: AC
  Administered 2014-10-03: 1 via RECTAL
  Filled 2014-10-03: qty 1

## 2014-10-03 MED ORDER — HYDRALAZINE HCL 20 MG/ML IJ SOLN
10.0000 mg | Freq: Four times a day (QID) | INTRAMUSCULAR | Status: DC | PRN
  Administered 2014-10-03 – 2014-10-05 (×3): 10 mg via INTRAVENOUS
  Filled 2014-10-03 (×4): qty 1

## 2014-10-03 MED ORDER — SORBITOL 70 % SOLN
30.0000 mL | Status: DC
Start: 2014-10-03 — End: 2014-10-04
  Administered 2014-10-03 (×3): 30 mL via ORAL
  Filled 2014-10-03 (×4): qty 30

## 2014-10-03 NOTE — Progress Notes (Signed)
Pt has vomited X 2 today, right after each dose of scheduled Sorbitol (and morning meds). Zofran given IV as ordered. BP elevated. Gave ordered PRN dose of Apresoline. Will continue to monitor.  At 1540 today, pt had medium, soft, brown stool.

## 2014-10-03 NOTE — Progress Notes (Signed)
Pt arrived to unit via stretcher room 1502. alerrt and oriented, no complaint of pain. Pt guide at bedside. Oriented to room and callbell with no complications. Initial assessment completed, will continue to monitor pt.

## 2014-10-03 NOTE — Progress Notes (Signed)
[facility] PROGRESS NOTE  Rosie Torrez Bhalla ZWC:[id number] DOB: 1923-08-16 DOA: 10/02/2014 PCP: Mathews Argyle, MD  Assessment/Plan: Acute diverticulitis -Cont on Cipro and Flagyl -Supportive therapy -Mild changes on CT abd   Severe constipation - On mult pain meds  - Add stool softners and miralax daily - Give sorbitol concerning about pt's abd pain diffuse with possible fecal impaction.  Hypertension - poorly controlled - Increase amlodepin to 10mg  daily   Chr pain On mult sedative and pain meds - Consider weaning at her age  - History of breast cancer -Stable continue home regimen  Debility - PT consult   Code Status: full code DVT Prophylaxis: heparin Family Communication: patient Disposition Plan: based on Pt eval    Antibiotics:  Cipro and flagyl 3/18  HPI/Subjective: [name] is a 81 y.o. female  With history of breast cancer presenting to the ED complaining of one week of abdominal discomfort,persistent and gradually getting worse. Patient presented to her primary care physicians 2 days prior to admission date for evaluation of which CT abdomen and pelvis was obtained. There was mention of diverticulitis with no abscess. Patient states that her condition worsened as such presented to the hospital for further evaluation. Given her recent imaging patient was not rescanned and we were consulted for further medical evaluation recommendations. Patient does also endorse about of emesis earlier this morning. Still c/o pain in the abd    Objective: Filed Vitals:   10/03/14 0630  BP: 144/59  Pulse: 65  Temp: 98.6 F (37 C)  Resp: 16   No intake or output data in the 24 hours ending 10/03/14 0911 There were no vitals filed for this visit.  Exam:   General: Appears calm and comfortable  Eyes: PERRL, normal lids, irises & conjunctiva  ENT: grossly normal hearing, lips & tongue  Neck: no LAD, masses or thyromegaly  Cardiovascular:  RRR, no m/r/g. No LE edema.  Respiratory: CTA bilaterally, no w/r/r. Normal respiratory effort.  Abdomen: diffuse tenderness, mild distention with fullness.  Skin: no rash or induration seen on limited exam  Musculoskeletal: grossly normal tone BUE/BLE  Psychiatric: grossly normal mood and affect, speech fluent and appropriate  Neurologic: grossly non-focal.              Data Reviewed: Basic Metabolic Panel:  Recent Labs Lab 10/02/14 1200 10/03/14 0511  NA 141 140  K 4.0 3.4*  CL 105 105  CO2 27 26  GLUCOSE 115* 90  BUN 16 14  CREATININE 1.02 0.99  CALCIUM 8.7 8.2*   Liver Function Tests:  Recent Labs Lab 10/02/14 1200  AST 21  ALT 11  ALKPHOS 56  BILITOT 0.5  PROT 6.9  ALBUMIN 3.8    Recent Labs Lab 10/02/14 1200  LIPASE 15   No results for input(s): AMMONIA in the last 168 hours. CBC:  Recent Labs Lab 10/02/14 1200 10/03/14 0511  WBC 6.8 6.2  NEUTROABS 5.8  --   HGB 12.5 11.1*  HCT 39.7 35.3*  MCV 98.0 97.8  PLT 223 193   Cardiac Enzymes: No results for input(s): CKTOTAL, CKMB, CKMBINDEX, TROPONINI in the last 168 hours. BNP (last 3 results) No results for input(s): BNP in the last 8760 hours.  ProBNP (last 3 results) No results for input(s): PROBNP in the last 8760 hours.  CBG: No results for input(s): GLUCAP in the last 168 hours.  No results found for this or any previous visit (from the past 240 hour(s)).   Studies: No  results found.  Scheduled Meds: . amLODipine  2.5 mg Oral Daily  . aspirin EC  81 mg Oral Daily  . brimonidine  1 drop Both Eyes BID  . cholecalciferol  400 Units Oral Daily  . ciprofloxacin  400 mg Intravenous Q12H  . heparin  5,000 Units Subcutaneous 3 times per day  . levothyroxine  112 mcg Oral QAC breakfast  . metronidazole  500 mg Intravenous Q8H  . mirtazapine  15 mg Oral QHS  . OxyCODONE  30 mg Oral Q12H  . polyethylene glycol  17 g Oral Daily  . rOPINIRole  0.5 mg Oral Daily  .  senna-docusate  1 tablet Oral BID  . sodium chloride  3 mL Intravenous Q12H  . sorbitol  30 mL Oral Q4H  . tamoxifen  10 mg Oral Daily   Continuous Infusions:   Active Problems:   S/P mastectomy   Acute diverticulitis    Time spent: 35 min    Palmina Clodfelter  [facility] Pager [phone] If 7PM-7AM, please contact night-coverage at [email or web address], password [facility] 10/03/2014, 9:11 AM  LOS: 1 day

## 2014-10-04 DIAGNOSIS — E038 Other specified hypothyroidism: Secondary | ICD-10-CM

## 2014-10-04 DIAGNOSIS — C50919 Malignant neoplasm of unspecified site of unspecified female breast: Secondary | ICD-10-CM

## 2014-10-04 DIAGNOSIS — I10 Essential (primary) hypertension: Secondary | ICD-10-CM

## 2014-10-04 MED ORDER — POLYETHYLENE GLYCOL 3350 17 G PO PACK
17.0000 g | PACK | Freq: Two times a day (BID) | ORAL | Status: DC
  Administered 2014-10-04 – 2014-10-06 (×4): 17 g via ORAL
  Filled 2014-10-04 (×4): qty 1

## 2014-10-04 NOTE — Clinical Social Work Note (Signed)
CSW attempted to do psychosocial assessment but pt was asleep  Pt is from [facility]  CSW will leave hand off for follow up with week day CSW to obtain assessment.   Dede Query, LCSW [place] Worker - Weekend Coverage cell #: [phone]

## 2014-10-04 NOTE — Progress Notes (Signed)
Patient ID: [name], female   DOB: 05-Mar-1924, 81 y.o.   MRN: [id number] [facility] PROGRESS NOTE  [name] JIR:[id number] DOB: [birth year] DOA: 10/02/2014 PCP: Mathews Argyle, MD  Brief narrative:    81 y.o. female with past medical history of breast cancer (on tamoxifen), hypertension who presented to ED with worsening abdominal pain for past weeks or so prior to this admission. She saw PCP prior to admission and CT done was suspicious for sigmoid diverticulitis. In ED, pt was hemodynamically stable. Pt started cipro and flagyl.   Assessment/Plan:    Principal Problem: Acute diverticulitis - Questionable sigmoid diverticulitis on CT abdomen - Started on cipro and flagyl - On clear liquid diet - Will advance as tolerated    Active Problems: Severe constipation - Continue senna and miralax - Had 1 BM in last 24 hours   Hypertension - Continue Norvasc daily  Hypothyroidism - Continue synthroid  Chronic pain - On OxyContin 30 mg Q 12 hours - Continue dilaudid 0.5 mg IV ever 4 hours PRN severe pain  History of breast cancer - Continue tamoxifen   Debility - Follow up on recommendations for PT   DVT Prophylaxis  - Heparin subQ ordered    Code Status: Full.  Family Communication:  Family not at the bedside  Disposition Plan:  On abx for diverticulitis, not stable for discharge   IV access:  Peripheral IV  Procedures and diagnostic studies:    CT abd 10/02/2014 - Questionable mild wall thickening of the cecum without surrounding inflammatory changes. Wall thickening and pericolic infiltration at the sigmoid colon question related to subtle sigmoid diverticulitis versus segmental colitis. Increased stool in colon clinically consider constipation. No evidence of bowel perforation or abscess.  Medical Consultants:  None  Other Consultants:  Physical therapy  IAnti-Infectives:   Cipro 10/02/2014 --> Flagyl 10/02/2014 -->   Leisa Lenz,  MD  [facility] Pager [phone]  If 7PM-7AM, please contact night-coverage [email or web address] Password [email or web address] 10/04/2014, 6:00 PM   LOS: 2 days    HPI/Subjective: No acute overnight events.  Objective: Filed Vitals:   10/03/14 1754 10/03/14 2151 10/04/14 0540 10/04/14 1550  BP: 150/122 168/72 146/55 158/74  Pulse:  92 70 74  Temp:  98.8 F (37.1 C) 99.6 F (37.6 C) 98.6 F (37 C)  TempSrc:  Oral Oral Oral  Resp:  18 18 18   SpO2:  96% 94% 96%    Intake/Output Summary (Last 24 hours) at 10/04/14 1800 Last data filed at 10/04/14 0842  Gross per 24 hour  Intake     80 ml  Output      0 ml  Net     80 ml    Exam:   General:  Pt is alert, not in acute distress  Cardiovascular: RRR, S1/S2 (+)  Respiratory: no wheezing, no crackles   Abdomen: non distended, (+) BS  Extremities: No edema, pulses DP and PT palpable bilaterally  Neuro: Grossly nonfocal  Data Reviewed: Basic Metabolic Panel:  Recent Labs Lab 10/02/14 1200 10/03/14 0511  NA 141 140  K 4.0 3.4*  CL 105 105  CO2 27 26  GLUCOSE 115* 90  BUN 16 14  CREATININE 1.02 0.99  CALCIUM 8.7 8.2*   Liver Function Tests:  Recent Labs Lab 10/02/14 1200  AST 21  ALT 11  ALKPHOS 56  BILITOT 0.5  PROT 6.9  ALBUMIN 3.8    Recent Labs Lab 10/02/14 1200  LIPASE 15   No results for input(s):  AMMONIA in the last 168 hours. CBC:  Recent Labs Lab 10/02/14 1200 10/03/14 0511  WBC 6.8 6.2  NEUTROABS 5.8  --   HGB 12.5 11.1*  HCT 39.7 35.3*  MCV 98.0 97.8  PLT 223 193   Cardiac Enzymes: No results for input(s): CKTOTAL, CKMB, CKMBINDEX, TROPONINI in the last 168 hours. BNP: Invalid input(s): POCBNP CBG: No results for input(s): GLUCAP in the last 168 hours.  No results found for this or any previous visit (from the past 240 hour(s)).   Scheduled Meds: . amLODipine  10 mg Oral Daily  . aspirin EC  81 mg Oral Daily  . brimonidine  1 drop Both Eyes BID  . cholecalciferol  400 Units  Oral Daily  . ciprofloxacin  400 mg Intravenous Q12H  . heparin  5,000 Units Subcutaneous 3 times per day  . levothyroxine  112 mcg Oral QAC breakfast  . metronidazole  500 mg Intravenous Q8H  . mirtazapine  15 mg Oral QHS  . OxyCODONE  30 mg Oral Q12H  . polyethylene glycol  17 g Oral Daily  . rOPINIRole  0.5 mg Oral Daily  . senna-docusate  1 tablet Oral BID  . tamoxifen  10 mg Oral Daily

## 2014-10-04 NOTE — Progress Notes (Signed)
UR Completed.  [phone]  

## 2014-10-05 DIAGNOSIS — Z853 Personal history of malignant neoplasm of breast: Secondary | ICD-10-CM

## 2014-10-05 DIAGNOSIS — I10 Essential (primary) hypertension: Secondary | ICD-10-CM | POA: Insufficient documentation

## 2014-10-05 DIAGNOSIS — R5381 Other malaise: Secondary | ICD-10-CM | POA: Insufficient documentation

## 2014-10-05 NOTE — Progress Notes (Signed)
Patient ID: [name], female   DOB: 1924-04-10, 81 y.o.   MRN: [id number] [facility] PROGRESS NOTE  [name] SAY:[id number] DOB: 08-19-1923 DOA: 10/02/2014 PCP: Mathews Argyle, MD  Brief narrative:    81 y.o. female with past medical history of breast cancer (on tamoxifen), hypertension who presented to ED with worsening abdominal pain for past weeks or so prior to this admission. She saw PCP prior to admission and CT done was suspicious for sigmoid diverticulitis. In ED, pt was hemodynamically stable. Pt started cipro and flagyl.   Assessment/Plan:    Principal Problem: Acute diverticulitis - Sigmoid diverticulitis seen on CT abdomen. Patient was started on Cipro and Flagyl. - We will advance diet to regular today.   Active Problems: Severe constipation - Continue senna and miralax - Has had daily bowel movement.  Hypertension - Continue Norvasc daily  Hypothyroidism - Continue synthroid  Chronic pain - Continue pain management efforts. Per physical therapy evaluation recommendation is for skilled nursing facility placement. - Social work assisting discharge planning.  History of breast cancer - Continue tamoxifen   Debility - To skilled nursing facility.   DVT Prophylaxis  - Heparin subQ ordered    Code Status: Full.  Family Communication:  Family not at the bedside  Disposition Plan:  To skilled nursing facility likely in next 24-48 hours depending on if patient tolerates regular diet.  IV access:  Peripheral IV  Procedures and diagnostic studies:    CT abd 10/02/2014 - Questionable mild wall thickening of the cecum without surrounding inflammatory changes. Wall thickening and pericolic infiltration at the sigmoid colon question related to subtle sigmoid diverticulitis versus segmental colitis. Increased stool in colon clinically consider constipation. No evidence of bowel perforation or abscess.  Medical Consultants:  None  Other  Consultants:  Physical therapy  IAnti-Infectives:   Cipro 10/02/2014 --> Flagyl 10/02/2014 -->   Leisa Lenz, MD  [facility] Pager [phone]  If 7PM-7AM, please contact night-coverage [email or web address] Password [facility] 10/05/2014, 11:21 AM   LOS: 3 days    HPI/Subjective: No acute overnight events.  Objective: Filed Vitals:   10/04/14 0540 10/04/14 1550 10/04/14 2143 10/05/14 0551  BP: 146/55 158/74 137/62 183/54  Pulse: 70 74 67 61  Temp: 99.6 F (37.6 C) 98.6 F (37 C) 98 F (36.7 C) 98 F (36.7 C)  TempSrc: Oral Oral Oral Oral  Resp: 18 18 20 20   SpO2: 94% 96% 95% 93%    Intake/Output Summary (Last 24 hours) at 10/05/14 1121 Last data filed at 10/05/14 0143  Gross per 24 hour  Intake    120 ml  Output      3 ml  Net    117 ml    Exam:   General:  Pt is more alert, no distress  Cardiovascular: RRR, S1/S2 (+)  Respiratory: Clear to auscultation bilaterally, no wheezing  Abdomen: Abdomen tender to deep palpation in mid abdomen, no distention, appreciate bowel sounds  Extremities: Pulses palpable, no edema  Neuro: No focal deficits  Data Reviewed: Basic Metabolic Panel:  Recent Labs Lab 10/02/14 1200 10/03/14 0511  NA 141 140  K 4.0 3.4*  CL 105 105  CO2 27 26  GLUCOSE 115* 90  BUN 16 14  CREATININE 1.02 0.99  CALCIUM 8.7 8.2*   Liver Function Tests:  Recent Labs Lab 10/02/14 1200  AST 21  ALT 11  ALKPHOS 56  BILITOT 0.5  PROT 6.9  ALBUMIN 3.8    Recent Labs Lab 10/02/14 1200  LIPASE 15   No results for input(s): AMMONIA in the last 168 hours. CBC:  Recent Labs Lab 10/02/14 1200 10/03/14 0511  WBC 6.8 6.2  NEUTROABS 5.8  --   HGB 12.5 11.1*  HCT 39.7 35.3*  MCV 98.0 97.8  PLT 223 193   Cardiac Enzymes: No results for input(s): CKTOTAL, CKMB, CKMBINDEX, TROPONINI in the last 168 hours. BNP: Invalid input(s): POCBNP CBG: No results for input(s): GLUCAP in the last 168 hours.  No results found for this or  any previous visit (from the past 240 hour(s)).   Scheduled Meds: . amLODipine  10 mg Oral Daily  . aspirin EC  81 mg Oral Daily  . brimonidine  1 drop Both Eyes BID  . cholecalciferol  400 Units Oral Daily  . ciprofloxacin  400 mg Intravenous Q12H  . heparin  5,000 Units Subcutaneous 3 times per day  . levothyroxine  112 mcg Oral QAC breakfast  . metronidazole  500 mg Intravenous Q8H  . mirtazapine  15 mg Oral QHS  . OxyCODONE  30 mg Oral Q12H  . polyethylene glycol  17 g Oral Daily  . rOPINIRole  0.5 mg Oral Daily  . senna-docusate  1 tablet Oral BID  . tamoxifen  10 mg Oral Daily

## 2014-10-05 NOTE — Evaluation (Signed)
Physical Therapy Evaluation Patient Details Name: [name] MRN: [id number] DOB: 04-Nov-1923 Today's Date: 10/05/2014   History of Present Illness  81 yo female admitted 10/02/14 with abdominal pain.Questionable sigmoid diverticulitis on CT abdomen. Has h/o breast cancer  Clinical Impression  Patient tolerated short room distance ambulation with 1 person assist. Patient will benefit from PT to address problems listed in note below.    Follow Up Recommendations SNF;Supervision/Assistance - 24 hour    Equipment Recommendations  None recommended by PT    Recommendations for Other Services       Precautions / Restrictions Precautions Precautions: Fall Precaution Comments: mac. degen.      Mobility  Bed Mobility Overal bed mobility: Needs Assistance Bed Mobility: Supine to Sit     Supine to sit: Min assist     General bed mobility comments: support trunk into upright.  Transfers Overall transfer level: Needs assistance Equipment used: Rolling walker (2 wheeled) Transfers: Sit to/from [facility] Sit to Stand: Mod assist Stand pivot transfers: Mod assist       General transfer comment: cues for safety, reaching to [facility], cues for  ma reaching for RW  Ambulation/Gait Ambulation/Gait assistance: Mod assist Ambulation Distance (Feet): 15 Feet Assistive device: Rolling walker (2 wheeled)       General Gait Details: cues for moving around end of bed, has visual impairment  Stairs            Wheelchair Mobility    Modified Rankin (Stroke Patients Only)       Balance Overall balance assessment: Needs assistance Sitting-balance support: Feet supported;No upper extremity supported Sitting balance-Leahy Scale: Good     Standing balance support: During functional activity;Bilateral upper extremity supported Standing balance-Leahy Scale: Fair                               Pertinent Vitals/Pain Pain Assessment: No/denies pain     Home Living Family/patient expects to be discharged to:: Other (Comment) (independent living  at [facility])               Home Equipment: Gilford Rile - 2 wheels      Prior Function Level of Independence: Independent with assistive device(s);Needs assistance   Gait / Transfers Assistance Needed: uses RW, gets assist with bath.           Hand Dominance        Extremity/Trunk Assessment   Upper Extremity Assessment: Generalized weakness           Lower Extremity Assessment: Generalized weakness         Communication      Cognition Arousal/Alertness: Awake/alert Behavior During Therapy: WFL for tasks assessed/performed Overall Cognitive Status: Within Functional Limits for tasks assessed                 General Comments: patient knew hospital, chose [facility] with choices,  appears WFL, little off on date. cannot see calendar.    General Comments      Exercises        Assessment/Plan    PT Assessment Patient needs continued PT services  PT Diagnosis Difficulty walking;Generalized weakness   PT Problem List Decreased strength;Decreased activity tolerance;Decreased mobility;Decreased balance;Decreased safety awareness  PT Treatment Interventions DME instruction;Gait training;Functional mobility training;Therapeutic activities;Therapeutic exercise;Patient/family education   PT Goals (Current goals can be found in the Care Plan section) Acute Rehab PT Goals Patient Stated Goal: to go to reahb section of  [place] PT Goal Formulation: With patient Time For Goal Achievement: 10/19/14 Potential to Achieve Goals: Good    Frequency Min 3X/week   Barriers to discharge        Co-evaluation               End of Session Equipment Utilized During Treatment: Gait belt Activity Tolerance: Patient tolerated treatment well Patient left: in chair;with call bell/phone within reach Nurse Communication: Mobility status (on chair alarm pad but not  connected to box)         Time: 3428-7681 PT Time Calculation (min) (ACUTE ONLY): 23 min   Charges:   PT Evaluation $Initial PT Evaluation Tier I: 1 Procedure PT Treatments $Gait Training: 8-22 mins   PT G Codes:        [name] 10/05/2014, 10:46 AM [name] PT [phone]

## 2014-10-06 LAB — GI PATHOGEN PANEL BY PCR, STOOL
C difficile toxin A/B: NOT DETECTED
CAMPYLOBACTER BY PCR: NOT DETECTED
CRYPTOSPORIDIUM BY PCR: NOT DETECTED
E COLI (ETEC) LT/ST: NOT DETECTED
E COLI 0157 BY PCR: NOT DETECTED
E coli (STEC): NOT DETECTED
G LAMBLIA BY PCR: NOT DETECTED
NOROVIRUS G1/G2: NOT DETECTED
Rotavirus A by PCR: NOT DETECTED
Salmonella by PCR: NOT DETECTED
Shigella by PCR: NOT DETECTED

## 2014-10-06 MED ORDER — METRONIDAZOLE 500 MG PO TABS: 500.0000 mg | ORAL_TABLET | Freq: Three times a day (TID) | ORAL | Status: AC

## 2014-10-06 MED ORDER — OXYCODONE-ACETAMINOPHEN 10-325 MG PO TABS: 1.0000 | ORAL_TABLET | Freq: Every day | ORAL | Status: AC

## 2014-10-06 MED ORDER — OXYCODONE HCL ER 30 MG PO T12A: 30.0000 mg | EXTENDED_RELEASE_TABLET | Freq: Two times a day (BID) | ORAL | Status: AC

## 2014-10-06 MED ORDER — SENNOSIDES-DOCUSATE SODIUM 8.6-50 MG PO TABS: 1.0000 | ORAL_TABLET | Freq: Two times a day (BID) | ORAL | Status: AC

## 2014-10-06 MED ORDER — CIPROFLOXACIN HCL 500 MG PO TABS: 500.0000 mg | ORAL_TABLET | Freq: Two times a day (BID) | ORAL | Status: AC

## 2014-10-06 NOTE — Progress Notes (Addendum)
Pt from [facility] is returning to SNF today. [facility] transport required. Pt / son are aware out of pocket costs may be associated with [facility] transport. [employer] has provided prior authorization for SNF. Ambulance authorization has been requested. NSG reviewed d/c summary, scripts, avs. Scripts included in d/c packet.   Werner Lean LCSW [phone]  [phone]  [facility] called for transport. Delays with pick up reported due to high volume of requests.  Werner Lean LCSW [phone]

## 2014-10-06 NOTE — Progress Notes (Signed)
Report called and given to [facility], Therapist, art at [facility] facility. Staff has no questions at this time. Unit number left for staff if they should have questions. Patient transported via [place].

## 2014-10-06 NOTE — Discharge Summary (Signed)
Physician Discharge Summary  [name] NFA:[id number] DOB: [birth year] DOA: 10/02/2014  PCP: Mathews Argyle, MD  Admit date: 10/02/2014 Discharge date: 10/06/2014  Recommendations for Outpatient Follow-up:  1. Take ciprofloxacin and Flagyl as prescribed for 6 more days on discharge for treatment of diverticulitis.  Discharge Diagnoses:  Active Problems:   S/P mastectomy   Acute diverticulitis   History of breast cancer   Essential hypertension   Debility    Discharge Condition: stable   Diet recommendation: as tolerated   History of present illness:  81 y.o. female with past medical history of breast cancer (on tamoxifen), hypertension who presented to ED with worsening abdominal pain for past weeks or so prior to this admission. She saw PCP prior to admission and CT done was suspicious for sigmoid diverticulitis. In ED, pt was hemodynamically stable. Pt started cipro and flagyl.   Assessment/Plan:    Principal Problem: Acute diverticulitis - Sigmoid diverticulitis seen on CT abdomen. Patient was started on Cipro and Flagyl. - Patient tolerated regular diet. Continue Cipro and Flagyl for 6 more days on discharge.  Active Problems: Severe constipation - Continue senna and miralax - Has had daily bowel movement.  Hypertension - Continue Norvasc daily  Hypothyroidism - Continue synthroid  Chronic pain - Continue pain management efforts. Please note we stopped methadone.   History of breast cancer - Continue tamoxifen   Debility - To skilled nursing facility.   DVT Prophylaxis  - Heparin subQ ordered while pt is in hospital    Code Status: Full.  Family Communication: Family not at the bedside    IV access:  Peripheral IV  Procedures and diagnostic studies:   CT abd 10/02/2014 - Questionable mild wall thickening of the cecum without surrounding inflammatory changes. Wall thickening and pericolic infiltration at the sigmoid colon  question related to subtle sigmoid diverticulitis versus segmental colitis. Increased stool in colon clinically consider constipation. No evidence of bowel perforation or abscess.  Medical Consultants:  None  Other Consultants:  Physical therapy  IAnti-Infectives:   Cipro 10/02/2014 --> for 6 days on discahrge Flagyl 10/02/2014 --> for 6 days on discharge  Signed:  Leisa Lenz, MD  [facility] 10/06/2014, 10:04 AM  Pager #: [phone]  Discharge Exam: Filed Vitals:   10/06/14 0531  BP: 144/69  Pulse: 67  Temp: 97.9 F (36.6 C)  Resp: 20   Filed Vitals:   10/05/14 0551 10/05/14 1332 10/05/14 2207 10/06/14 0531  BP: 183/54 138/51 141/63 144/69  Pulse: 61 77 78 67  Temp: 98 F (36.7 C) 99.8 F (37.7 C) 98.5 F (36.9 C) 97.9 F (36.6 C)  TempSrc: Oral Oral Oral Oral  Resp: 20 18 18 20   SpO2: 93% 95% 94% 94%    General: Pt is alert,  not in acute distress Cardiovascular: Regular rate and rhythm, S1/S2 +, Respiratory: no wheezing, no crackles, no rhonchi Abdominal: Soft, non tender, bowel sounds +, no guarding Extremities: no cyanosis, pulses palpable bilaterally Neuro: Grossly nonfocal  Discharge Instructions  Discharge Instructions    Call MD for:  difficulty breathing, headache or visual disturbances    Complete by:  As directed      Call MD for:  persistant dizziness or light-headedness    Complete by:  As directed      Call MD for:  redness, tenderness, or signs of infection (pain, swelling, redness, odor or green/yellow discharge around incision site)    Complete by:  As directed      Call MD  for:  severe uncontrolled pain    Complete by:  As directed      Diet - low sodium heart healthy    Complete by:  As directed      Discharge instructions    Complete by:  As directed   1. Take ciprofloxacin and Flagyl as prescribed for 6 more days on discharge for treatment of diverticulitis.     Increase activity slowly    Complete by:  As  directed             Medication List    STOP taking these medications        methadone 5 MG tablet  Commonly known as:  DOLOPHINE     methocarbamol 500 MG tablet  Commonly known as:  ROBAXIN      TAKE these medications        alendronate 35 MG tablet  Commonly known as:  FOSAMAX  Take 35 mg by mouth every 7 (seven) days. On Tuesday     amLODipine 2.5 MG tablet  Commonly known as:  NORVASC  Take 1 tablet by mouth daily.     aspirin 81 MG tablet  Take 81 mg by mouth daily.     brimonidine 0.2 % ophthalmic solution  Commonly known as:  ALPHAGAN  Place 1 drop into both eyes 2 (two) times daily.     ciprofloxacin 500 MG tablet  Commonly known as:  CIPRO  Take 1 tablet (500 mg total) by mouth 2 (two) times daily.     ketoconazole 2 % cream  Commonly known as:  NIZORAL     levothyroxine 112 MCG tablet  Commonly known as:  SYNTHROID, LEVOTHROID  Take 112 mcg by mouth daily before breakfast.     metroNIDAZOLE 500 MG tablet  Commonly known as:  FLAGYL  Take 1 tablet (500 mg total) by mouth 3 (three) times daily.     mirtazapine 15 MG tablet  Commonly known as:  REMERON  Take 15 mg by mouth at bedtime.     omeprazole 40 MG capsule  Commonly known as:  PRILOSEC  Take 1 capsule by mouth daily.     OxyCODONE HCl ER 30 MG T12a  Commonly known as:  OXYCONTIN  Take 30 mg by mouth every 12 (twelve) hours.     oxyCODONE-acetaminophen 10-325 MG per tablet  Commonly known as:  PERCOCET  Take 1 tablet by mouth 5 (five) times daily.     polyethylene glycol powder powder  Commonly known as:  GLYCOLAX/MIRALAX  Take 17 g by mouth daily.     rOPINIRole 0.5 MG tablet  Commonly known as:  REQUIP  Take 0.5 mg by mouth daily.     senna-docusate 8.6-50 MG per tablet  Commonly known as:  Senokot-S  Take 1 tablet by mouth 2 (two) times daily.     tamoxifen 10 MG tablet  Commonly known as:  NOLVADEX  Take 1 tablet by mouth daily.     VITAMIN D-400 400 UNITS Tabs tablet   Generic drug:  cholecalciferol  Take 400 Units by mouth daily.           Follow-up Information    Follow up with Mathews Argyle, MD. Schedule an appointment as soon as possible for a visit in 1 week.   Specialty:  Internal Medicine   Why:  Follow up appt after recent hospitalization   Contact information:   [address] [place] [address] [phone]  The results of significant diagnostics from this hospitalization (including imaging, microbiology, ancillary and laboratory) are listed below for reference.    Significant Diagnostic Studies: Ct Abdomen Pelvis Wo Contrast  09/30/2014   CLINICAL DATA:  Lower abdominal pain, RIGHT lower quadrant tenderness, decreased appetite, history BILATERAL breast cancer, question diverticulitis  EXAM: CT ABDOMEN AND PELVIS WITHOUT CONTRAST  TECHNIQUE: Multidetector CT imaging of the abdomen and pelvis was performed following the standard protocol without IV contrast. Sagittal and coronal MPR images reconstructed from axial data set.  COMPARISON:  04/08/2013  FINDINGS: Minimal scarring at lung bases.  Scattered atherosclerotic calcifications aorta and coronary arteries. Small exophytic cysts in both kidneys.  Nonspecific low-attenuation focus lateral segment LEFT lobe liver 4 mm diameter image 20.  Remainder of liver, spleen, pancreas, kidneys, and adrenal glands normal.  Increased stool throughout colon consistent with constipation.  Minimal wall thickening of the cecum is questioned without inflammatory changes.  Wall thickening of the sigmoid colon with pericolic infiltrative changes.  Single sigmoid diverticulum identified.  This could represent subtle sigmoid diverticulitis or segmental colitis.  Stomach and small bowel loops normal appearance for technique.  Scattered normal size retroperitoneal nodes.  No mass, adenopathy, free fluid or free air otherwise identified.  Bones diffusely demineralized.  Orthopedic hardware  proximal LEFT femur.  IMPRESSION: Questionable mild wall thickening of the cecum without surrounding inflammatory changes.  Wall thickening and pericolic infiltration at the sigmoid colon question related to subtle sigmoid diverticulitis versus segmental colitis.  Increased stool in colon clinically consider constipation.  No evidence of bowel perforation or abscess.   Electronically Signed   By: Lavonia Dana M.D.   On: 09/30/2014 14:37    Microbiology: No results found for this or any previous visit (from the past 240 hour(s)).   Labs: Basic Metabolic Panel:  Recent Labs Lab 10/02/14 1200 10/03/14 0511  NA 141 140  K 4.0 3.4*  CL 105 105  CO2 27 26  GLUCOSE 115* 90  BUN 16 14  CREATININE 1.02 0.99  CALCIUM 8.7 8.2*   Liver Function Tests:  Recent Labs Lab 10/02/14 1200  AST 21  ALT 11  ALKPHOS 56  BILITOT 0.5  PROT 6.9  ALBUMIN 3.8    Recent Labs Lab 10/02/14 1200  LIPASE 15   No results for input(s): AMMONIA in the last 168 hours. CBC:  Recent Labs Lab 10/02/14 1200 10/03/14 0511  WBC 6.8 6.2  NEUTROABS 5.8  --   HGB 12.5 11.1*  HCT 39.7 35.3*  MCV 98.0 97.8  PLT 223 193   Cardiac Enzymes: No results for input(s): CKTOTAL, CKMB, CKMBINDEX, TROPONINI in the last 168 hours. BNP: BNP (last 3 results) No results for input(s): BNP in the last 8760 hours.  ProBNP (last 3 results) No results for input(s): PROBNP in the last 8760 hours.  CBG: No results for input(s): GLUCAP in the last 168 hours.  Time coordinating discharge: Over 30 minutes

## 2014-10-06 NOTE — Discharge Instructions (Signed)

## 2014-12-16 DEATH — deceased

## 2015-06-29 NOTE — Assessment & Plan Note (Deleted)
Bilateral breast cancers Right breast invasive ductal carcinoma treated with lumpectomy and radiation and observation Left breast invasive ductal carcinoma diagnosed December 2014 treated with left mastectomy ER/PR positive HER-2 negative Ki-67 20%, currently on tamoxifen 20 mg daily since January 2015  Tamoxifen toxicities: Patient has not been experiencing any side effects of tamoxifen. She denies any hot flashes or myalgias no problems with blood clots or uterine bleeding. I recommended continuing the treatment we will see her back in one year for followup to discuss whether or not she needs to continue further. Given her advanced age, if she has any side effects, we would discontinue tamoxifen therapy.  Surveillance: Today's breast exam did not reveal any abnormalities or lymphadenopathy. RTC in 1 year

## 2015-06-30 ENCOUNTER — Ambulatory Visit: Payer: Medicare Other | Admitting: Hematology and Oncology
